# Patient Record
Sex: Female | Born: 1950 | Race: White | Hispanic: No | Marital: Married | Smoking: Never smoker
Health system: Southern US, Community
[De-identification: ages and names within clinical notes are randomized; demographics above are authoritative.]

## PROBLEM LIST (undated history)

## (undated) DIAGNOSIS — J449 Chronic obstructive pulmonary disease, unspecified: Secondary | ICD-10-CM

## (undated) DIAGNOSIS — M797 Fibromyalgia: Secondary | ICD-10-CM

## (undated) DIAGNOSIS — R42 Dizziness and giddiness: Secondary | ICD-10-CM

## (undated) DIAGNOSIS — I509 Heart failure, unspecified: Secondary | ICD-10-CM

## (undated) DIAGNOSIS — I10 Essential (primary) hypertension: Secondary | ICD-10-CM

## (undated) DIAGNOSIS — E7800 Pure hypercholesterolemia, unspecified: Secondary | ICD-10-CM

## (undated) HISTORY — PX: SHOULDER SURGERY: SHX246

## (undated) HISTORY — PX: KNEE SURGERY: SHX244

## (undated) HISTORY — PX: BACK SURGERY: SHX140

## (undated) HISTORY — PX: ANTERIOR FUSION CERVICAL SPINE: SUR626

---

## 2014-05-12 ENCOUNTER — Encounter (HOSPITAL_COMMUNITY): Payer: Self-pay | Admitting: Emergency Medicine

## 2014-05-12 ENCOUNTER — Inpatient Hospital Stay (HOSPITAL_COMMUNITY)
Admission: EM | Admit: 2014-05-12 | Discharge: 2014-05-14 | DRG: 641 | Disposition: A | Discharge status: Home Health | Payer: Medicare Other | Attending: Internal Medicine | Admitting: Internal Medicine

## 2014-05-12 ENCOUNTER — Emergency Department (HOSPITAL_COMMUNITY): Payer: Medicare Other

## 2014-05-12 DIAGNOSIS — R42 Dizziness and giddiness: Secondary | ICD-10-CM

## 2014-05-12 DIAGNOSIS — W1830XA Fall on same level, unspecified, initial encounter: Secondary | ICD-10-CM | POA: Diagnosis present

## 2014-05-12 DIAGNOSIS — S0990XA Unspecified injury of head, initial encounter: Secondary | ICD-10-CM

## 2014-05-12 DIAGNOSIS — E861 Hypovolemia: Secondary | ICD-10-CM | POA: Diagnosis present

## 2014-05-12 DIAGNOSIS — Y92014 Private driveway to single-family (private) house as the place of occurrence of the external cause: Secondary | ICD-10-CM

## 2014-05-12 DIAGNOSIS — Z9181 History of falling: Secondary | ICD-10-CM

## 2014-05-12 DIAGNOSIS — R55 Syncope and collapse: Secondary | ICD-10-CM

## 2014-05-12 DIAGNOSIS — I951 Orthostatic hypotension: Secondary | ICD-10-CM | POA: Diagnosis present

## 2014-05-12 DIAGNOSIS — W19XXXA Unspecified fall, initial encounter: Secondary | ICD-10-CM | POA: Diagnosis present

## 2014-05-12 DIAGNOSIS — E780 Pure hypercholesterolemia: Secondary | ICD-10-CM | POA: Diagnosis present

## 2014-05-12 DIAGNOSIS — S0083XA Contusion of other part of head, initial encounter: Secondary | ICD-10-CM | POA: Diagnosis present

## 2014-05-12 DIAGNOSIS — E860 Dehydration: Secondary | ICD-10-CM | POA: Diagnosis not present

## 2014-05-12 DIAGNOSIS — I10 Essential (primary) hypertension: Secondary | ICD-10-CM | POA: Diagnosis present

## 2014-05-12 HISTORY — DX: Essential (primary) hypertension: I10

## 2014-05-12 HISTORY — DX: Dizziness and giddiness: R42

## 2014-05-12 HISTORY — DX: Pure hypercholesterolemia, unspecified: E78.00

## 2014-05-12 LAB — CBC WITH DIFFERENTIAL/PLATELET
BASOS ABS: 0 10*3/uL (ref 0.0–0.1)
BASOS PCT: 0 % (ref 0–1)
Eosinophils Absolute: 0.1 10*3/uL (ref 0.0–0.7)
Eosinophils Relative: 1 % (ref 0–5)
HCT: 32.7 % — ABNORMAL LOW (ref 36.0–46.0)
Hemoglobin: 11.3 g/dL — ABNORMAL LOW (ref 12.0–15.0)
Lymphocytes Relative: 28 % (ref 12–46)
Lymphs Abs: 2.4 10*3/uL (ref 0.7–4.0)
MCH: 32.1 pg (ref 26.0–34.0)
MCHC: 34.6 g/dL (ref 30.0–36.0)
MCV: 92.9 fL (ref 78.0–100.0)
MONO ABS: 0.8 10*3/uL (ref 0.1–1.0)
MONOS PCT: 10 % (ref 3–12)
NEUTROS ABS: 5.3 10*3/uL (ref 1.7–7.7)
NEUTROS PCT: 61 % (ref 43–77)
Platelets: 179 10*3/uL (ref 150–400)
RBC: 3.52 MIL/uL — ABNORMAL LOW (ref 3.87–5.11)
RDW: 13.5 % (ref 11.5–15.5)
WBC: 8.6 10*3/uL (ref 4.0–10.5)

## 2014-05-12 LAB — RAPID URINE DRUG SCREEN, HOSP PERFORMED
Amphetamines: NOT DETECTED
BENZODIAZEPINES: NOT DETECTED
Barbiturates: NOT DETECTED
COCAINE: NOT DETECTED
OPIATES: NOT DETECTED
Tetrahydrocannabinol: NOT DETECTED

## 2014-05-12 LAB — COMPREHENSIVE METABOLIC PANEL
ALK PHOS: 60 U/L (ref 39–117)
ALT: 13 U/L (ref 0–35)
ANION GAP: 15 (ref 5–15)
AST: 16 U/L (ref 0–37)
Albumin: 3.4 g/dL — ABNORMAL LOW (ref 3.5–5.2)
BILIRUBIN TOTAL: 0.4 mg/dL (ref 0.3–1.2)
BUN: 25 mg/dL — AB (ref 6–23)
CHLORIDE: 95 meq/L — AB (ref 96–112)
CO2: 21 meq/L (ref 19–32)
CREATININE: 1.08 mg/dL (ref 0.50–1.10)
Calcium: 8.8 mg/dL (ref 8.4–10.5)
GFR, EST AFRICAN AMERICAN: 62 mL/min — AB (ref >90)
GFR, EST NON AFRICAN AMERICAN: 54 mL/min — AB (ref >90)
GLUCOSE: 87 mg/dL (ref 70–99)
POTASSIUM: 4.3 meq/L (ref 3.7–5.3)
Sodium: 131 mEq/L — ABNORMAL LOW (ref 137–147)
Total Protein: 7 g/dL (ref 6.0–8.3)

## 2014-05-12 LAB — CREATININE, SERUM
Creatinine, Ser: 1.03 mg/dL (ref 0.50–1.10)
GFR, EST AFRICAN AMERICAN: 66 mL/min — AB (ref >90)
GFR, EST NON AFRICAN AMERICAN: 57 mL/min — AB (ref >90)

## 2014-05-12 LAB — URINALYSIS, ROUTINE W REFLEX MICROSCOPIC
Bilirubin Urine: NEGATIVE
GLUCOSE, UA: NEGATIVE mg/dL
Hgb urine dipstick: NEGATIVE
Ketones, ur: NEGATIVE mg/dL
LEUKOCYTES UA: NEGATIVE
NITRITE: NEGATIVE
Protein, ur: NEGATIVE mg/dL
Urobilinogen, UA: 0.2 mg/dL (ref 0.0–1.0)
pH: 6 (ref 5.0–8.0)

## 2014-05-12 LAB — ETHANOL: Alcohol, Ethyl (B): <11 mg/dL (ref 0–11)

## 2014-05-12 LAB — TROPONIN I: Troponin I: <0.3 ng/mL (ref <0.30)

## 2014-05-12 LAB — CBC
HCT: 32.9 % — ABNORMAL LOW (ref 36.0–46.0)
Hemoglobin: 11.3 g/dL — ABNORMAL LOW (ref 12.0–15.0)
MCH: 31.7 pg (ref 26.0–34.0)
MCHC: 34.3 g/dL (ref 30.0–36.0)
MCV: 92.4 fL (ref 78.0–100.0)
PLATELETS: 171 10*3/uL (ref 150–400)
RBC: 3.56 MIL/uL — AB (ref 3.87–5.11)
RDW: 13.4 % (ref 11.5–15.5)
WBC: 7.9 10*3/uL (ref 4.0–10.5)

## 2014-05-12 MED ORDER — RAMIPRIL 5 MG PO CAPS
5.0000 mg | ORAL_CAPSULE | Freq: Two times a day (BID) | ORAL | Status: DC
  Administered 2014-05-12: 5 mg via ORAL
  Filled 2014-05-12 (×2): qty 1

## 2014-05-12 MED ORDER — ALBUTEROL SULFATE HFA 108 (90 BASE) MCG/ACT IN AERS: 2.0000 | INHALATION_SPRAY | RESPIRATORY_TRACT | Status: DC | PRN

## 2014-05-12 MED ORDER — TIOTROPIUM BROMIDE MONOHYDRATE 18 MCG IN CAPS
18.0000 ug | ORAL_CAPSULE | Freq: Every day | RESPIRATORY_TRACT | Status: DC
  Administered 2014-05-13 – 2014-05-14 (×2): 18 ug via RESPIRATORY_TRACT
  Filled 2014-05-12: qty 5

## 2014-05-12 MED ORDER — NITROGLYCERIN 0.4 MG SL SUBL: 0.4000 mg | SUBLINGUAL_TABLET | SUBLINGUAL | Status: DC | PRN

## 2014-05-12 MED ORDER — SODIUM CHLORIDE 0.9 % IV SOLN
INTRAVENOUS | Status: DC
  Administered 2014-05-12 – 2014-05-14 (×4): via INTRAVENOUS

## 2014-05-12 MED ORDER — SODIUM CHLORIDE 0.9 % IV BOLUS (SEPSIS)
250.0000 mL | Freq: Once | INTRAVENOUS | Status: AC
  Administered 2014-05-12: 250 mL via INTRAVENOUS

## 2014-05-12 MED ORDER — LIDOCAINE 5 % EX PTCH
1.0000 | MEDICATED_PATCH | Freq: Every day | CUTANEOUS | Status: DC | PRN
  Filled 2014-05-12: qty 1

## 2014-05-12 MED ORDER — VERAPAMIL HCL ER 240 MG PO TBCR
240.0000 mg | EXTENDED_RELEASE_TABLET | Freq: Every day | ORAL | Status: DC
Start: 2014-05-12 — End: 2014-05-14
  Administered 2014-05-12: 240 mg via ORAL
  Filled 2014-05-12 (×2): qty 1

## 2014-05-12 MED ORDER — HYDROCHLOROTHIAZIDE 25 MG PO TABS: 25.0000 mg | ORAL_TABLET | Freq: Every day | ORAL | Status: DC | PRN

## 2014-05-12 MED ORDER — HEPARIN SODIUM (PORCINE) 5000 UNIT/ML IJ SOLN
5000.0000 [IU] | Freq: Three times a day (TID) | INTRAMUSCULAR | Status: DC
  Administered 2014-05-12 – 2014-05-14 (×6): 5000 [IU] via SUBCUTANEOUS
  Filled 2014-05-12 (×6): qty 1

## 2014-05-12 MED ORDER — OMEGA-3-ACID ETHYL ESTERS 1 G PO CAPS
1.0000 g | ORAL_CAPSULE | Freq: Every day | ORAL | Status: DC
  Administered 2014-05-13 – 2014-05-14 (×2): 1 g via ORAL
  Filled 2014-05-12 (×2): qty 1

## 2014-05-12 MED ORDER — MIRABEGRON ER 25 MG PO TB24
25.0000 mg | ORAL_TABLET | Freq: Every day | ORAL | Status: DC
  Administered 2014-05-13 – 2014-05-14 (×2): 25 mg via ORAL
  Filled 2014-05-12 (×2): qty 1

## 2014-05-12 MED ORDER — IRBESARTAN 150 MG PO TABS
150.0000 mg | ORAL_TABLET | Freq: Every day | ORAL | Status: DC
  Administered 2014-05-13: 150 mg via ORAL
  Filled 2014-05-12: qty 1

## 2014-05-12 MED ORDER — SODIUM CHLORIDE 0.9 % IJ SOLN
3.0000 mL | Freq: Two times a day (BID) | INTRAMUSCULAR | Status: DC
  Administered 2014-05-12 – 2014-05-13 (×3): 3 mL via INTRAVENOUS

## 2014-05-12 MED ORDER — MECLIZINE HCL 12.5 MG PO TABS
25.0000 mg | ORAL_TABLET | Freq: Once | ORAL | Status: AC
  Administered 2014-05-12: 25 mg via ORAL
  Filled 2014-05-12: qty 2

## 2014-05-12 MED ORDER — SODIUM CHLORIDE 0.9 % IV SOLN: INTRAVENOUS | Status: DC

## 2014-05-12 MED ORDER — ONDANSETRON HCL 4 MG/2ML IJ SOLN: 4.0000 mg | Freq: Four times a day (QID) | INTRAMUSCULAR | Status: DC | PRN

## 2014-05-12 MED ORDER — MILNACIPRAN HCL 50 MG PO TABS
100.0000 mg | ORAL_TABLET | Freq: Two times a day (BID) | ORAL | Status: DC
  Administered 2014-05-13 – 2014-05-14 (×3): 100 mg via ORAL
  Filled 2014-05-12 (×8): qty 2

## 2014-05-12 MED ORDER — ROSUVASTATIN CALCIUM 10 MG PO TABS
10.0000 mg | ORAL_TABLET | Freq: Every day | ORAL | Status: DC
  Administered 2014-05-13: 10 mg via ORAL
  Filled 2014-05-12: qty 1

## 2014-05-12 MED ORDER — ACETAMINOPHEN 325 MG PO TABS
650.0000 mg | ORAL_TABLET | Freq: Four times a day (QID) | ORAL | Status: DC | PRN
  Administered 2014-05-12 – 2014-05-14 (×3): 650 mg via ORAL
  Filled 2014-05-12 (×4): qty 2

## 2014-05-12 MED ORDER — DEXTROMETHORPHAN-QUINIDINE 20-10 MG PO CAPS: 1.0000 | ORAL_CAPSULE | Freq: Two times a day (BID) | ORAL | Status: DC

## 2014-05-12 MED ORDER — RANOLAZINE ER 500 MG PO TB12
500.0000 mg | ORAL_TABLET | Freq: Two times a day (BID) | ORAL | Status: DC
  Administered 2014-05-12 – 2014-05-14 (×4): 500 mg via ORAL
  Filled 2014-05-12 (×4): qty 1

## 2014-05-12 MED ORDER — LUBIPROSTONE 24 MCG PO CAPS
24.0000 ug | ORAL_CAPSULE | Freq: Every day | ORAL | Status: DC | PRN
  Filled 2014-05-12: qty 1

## 2014-05-12 MED ORDER — ALBUTEROL SULFATE (2.5 MG/3ML) 0.083% IN NEBU: 2.5000 mg | INHALATION_SOLUTION | RESPIRATORY_TRACT | Status: DC | PRN

## 2014-05-12 MED ORDER — METOPROLOL SUCCINATE ER 50 MG PO TB24
50.0000 mg | ORAL_TABLET | Freq: Every day | ORAL | Status: DC
  Filled 2014-05-12: qty 1

## 2014-05-12 MED ORDER — PANTOPRAZOLE SODIUM 40 MG PO TBEC
40.0000 mg | DELAYED_RELEASE_TABLET | Freq: Every day | ORAL | Status: DC
  Administered 2014-05-13 – 2014-05-14 (×2): 40 mg via ORAL
  Filled 2014-05-12 (×2): qty 1

## 2014-05-12 MED ORDER — GABAPENTIN 100 MG PO CAPS
100.0000 mg | ORAL_CAPSULE | Freq: Three times a day (TID) | ORAL | Status: DC
  Administered 2014-05-12 – 2014-05-14 (×5): 100 mg via ORAL
  Filled 2014-05-12 (×5): qty 1

## 2014-05-12 MED ORDER — ONDANSETRON HCL 4 MG PO TABS: 4.0000 mg | ORAL_TABLET | Freq: Four times a day (QID) | ORAL | Status: DC | PRN

## 2014-05-12 NOTE — ED Notes (Signed)
Pt complains of dizziness on standing. Stands steady with 1 assist.
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
## 2014-05-12 NOTE — ED Notes (Signed)
Pt ambulated in hall, slightly unsteady, reports dizziness is better but still present.
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
## 2014-05-12 NOTE — ED Notes (Addendum)
Pt tripped and fell hitting back of head on cement, pt alert and oriented to name, year, age not to month and place ( pt stated it was Sept and aware that she  Was in hospital but unable to state which one or city)

## 2014-05-13 DIAGNOSIS — R55 Syncope and collapse: Secondary | ICD-10-CM

## 2014-05-13 DIAGNOSIS — E860 Dehydration: Principal | ICD-10-CM

## 2014-05-13 DIAGNOSIS — I951 Orthostatic hypotension: Secondary | ICD-10-CM

## 2014-05-13 DIAGNOSIS — I10 Essential (primary) hypertension: Secondary | ICD-10-CM

## 2014-05-13 LAB — COMPREHENSIVE METABOLIC PANEL
ALBUMIN: 3 g/dL — AB (ref 3.5–5.2)
ALT: 11 U/L (ref 0–35)
AST: 14 U/L (ref 0–37)
Alkaline Phosphatase: 55 U/L (ref 39–117)
Anion gap: 10 (ref 5–15)
BUN: 22 mg/dL (ref 6–23)
CALCIUM: 8.2 mg/dL — AB (ref 8.4–10.5)
CO2: 23 meq/L (ref 19–32)
CREATININE: 1.04 mg/dL (ref 0.50–1.10)
Chloride: 106 mEq/L (ref 96–112)
GFR calc Af Amer: 65 mL/min — ABNORMAL LOW (ref >90)
GFR calc non Af Amer: 56 mL/min — ABNORMAL LOW (ref >90)
Glucose, Bld: 121 mg/dL — ABNORMAL HIGH (ref 70–99)
Potassium: 4 mEq/L (ref 3.7–5.3)
Sodium: 139 mEq/L (ref 137–147)
TOTAL PROTEIN: 6.4 g/dL (ref 6.0–8.3)
Total Bilirubin: 0.3 mg/dL (ref 0.3–1.2)

## 2014-05-13 LAB — CBC
HEMATOCRIT: 32.2 % — AB (ref 36.0–46.0)
HEMOGLOBIN: 10.9 g/dL — AB (ref 12.0–15.0)
MCH: 31.7 pg (ref 26.0–34.0)
MCHC: 33.9 g/dL (ref 30.0–36.0)
MCV: 93.6 fL (ref 78.0–100.0)
PLATELETS: 172 10*3/uL (ref 150–400)
RBC: 3.44 MIL/uL — ABNORMAL LOW (ref 3.87–5.11)
RDW: 13.7 % (ref 11.5–15.5)
WBC: 4.3 10*3/uL (ref 4.0–10.5)

## 2014-05-13 LAB — TROPONIN I: Troponin I: <0.3 ng/mL (ref <0.30)

## 2014-05-13 MED ORDER — IBUPROFEN 800 MG PO TABS
400.0000 mg | ORAL_TABLET | Freq: Four times a day (QID) | ORAL | Status: DC | PRN
  Administered 2014-05-13: 400 mg via ORAL
  Filled 2014-05-13: qty 1

## 2014-05-13 MED ORDER — DEXTROMETHORPHAN-QUINIDINE 20-10 MG PO CAPS: 1.0000 | ORAL_CAPSULE | Freq: Two times a day (BID) | ORAL | Status: DC
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
## 2014-05-13 NOTE — Progress Notes (Signed)
Utilization review Completed Wenceslaus Gist RN BSN   

## 2014-05-13 NOTE — Progress Notes (Signed)
Pt blood pressure 98/44. Notified MD. Awaiting response. Will continue to monitor pt.

## 2014-05-14 MED ORDER — IBUPROFEN 800 MG PO TABS: 400.0000 mg | ORAL_TABLET | Freq: Four times a day (QID) | ORAL | Status: DC | PRN
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
## 2014-05-14 NOTE — Progress Notes (Signed)
Discharged home with instructions given on medications,and follow up visits,patient,and family verbalized understanding.No C/O pain or discomfort noted.Accompanied by staff to awaiting vehicle.

## 2014-05-14 NOTE — Progress Notes (Signed)
Patient ambulated in hallway times two assist.No c/o dizziness or pain noted. Patient tolerated ambulation well. Will continue to monitor.

## 2014-05-15 LAB — URINE CULTURE
COLONY COUNT: NO GROWTH
CULTURE: NO GROWTH

## 2015-09-21 DIAGNOSIS — R05 Cough: Secondary | ICD-10-CM | POA: Diagnosis not present

## 2015-09-21 DIAGNOSIS — R7303 Prediabetes: Secondary | ICD-10-CM | POA: Diagnosis not present

## 2015-09-21 DIAGNOSIS — I10 Essential (primary) hypertension: Secondary | ICD-10-CM | POA: Diagnosis not present

## 2015-09-21 DIAGNOSIS — Z7982 Long term (current) use of aspirin: Secondary | ICD-10-CM | POA: Diagnosis not present

## 2015-09-21 DIAGNOSIS — J209 Acute bronchitis, unspecified: Secondary | ICD-10-CM | POA: Diagnosis not present

## 2015-09-21 DIAGNOSIS — R509 Fever, unspecified: Secondary | ICD-10-CM | POA: Diagnosis not present

## 2015-09-21 DIAGNOSIS — R918 Other nonspecific abnormal finding of lung field: Secondary | ICD-10-CM | POA: Diagnosis not present

## 2015-09-21 DIAGNOSIS — Z79899 Other long term (current) drug therapy: Secondary | ICD-10-CM | POA: Diagnosis not present

## 2015-09-21 DIAGNOSIS — R911 Solitary pulmonary nodule: Secondary | ICD-10-CM | POA: Diagnosis not present

## 2015-10-10 DIAGNOSIS — R262 Difficulty in walking, not elsewhere classified: Secondary | ICD-10-CM | POA: Diagnosis not present

## 2015-10-10 DIAGNOSIS — M179 Osteoarthritis of knee, unspecified: Secondary | ICD-10-CM | POA: Diagnosis not present

## 2015-10-10 DIAGNOSIS — Z96652 Presence of left artificial knee joint: Secondary | ICD-10-CM | POA: Diagnosis not present

## 2015-10-30 DIAGNOSIS — I2584 Coronary atherosclerosis due to calcified coronary lesion: Secondary | ICD-10-CM | POA: Diagnosis not present

## 2015-10-30 DIAGNOSIS — K210 Gastro-esophageal reflux disease with esophagitis: Secondary | ICD-10-CM | POA: Diagnosis not present

## 2015-10-30 DIAGNOSIS — M25561 Pain in right knee: Secondary | ICD-10-CM | POA: Diagnosis not present

## 2015-10-30 DIAGNOSIS — Z6829 Body mass index (BMI) 29.0-29.9, adult: Secondary | ICD-10-CM | POA: Diagnosis not present

## 2015-10-30 DIAGNOSIS — M797 Fibromyalgia: Secondary | ICD-10-CM | POA: Diagnosis not present

## 2015-10-30 DIAGNOSIS — M1711 Unilateral primary osteoarthritis, right knee: Secondary | ICD-10-CM | POA: Diagnosis not present

## 2015-10-30 DIAGNOSIS — R2689 Other abnormalities of gait and mobility: Secondary | ICD-10-CM | POA: Diagnosis not present

## 2015-10-30 DIAGNOSIS — R262 Difficulty in walking, not elsewhere classified: Secondary | ICD-10-CM | POA: Diagnosis not present

## 2015-10-30 DIAGNOSIS — J441 Chronic obstructive pulmonary disease with (acute) exacerbation: Secondary | ICD-10-CM | POA: Diagnosis not present

## 2015-10-30 DIAGNOSIS — I10 Essential (primary) hypertension: Secondary | ICD-10-CM | POA: Diagnosis not present

## 2015-11-07 DIAGNOSIS — M25561 Pain in right knee: Secondary | ICD-10-CM | POA: Diagnosis not present

## 2015-11-07 DIAGNOSIS — M1711 Unilateral primary osteoarthritis, right knee: Secondary | ICD-10-CM | POA: Diagnosis not present

## 2015-11-14 DIAGNOSIS — M25561 Pain in right knee: Secondary | ICD-10-CM | POA: Diagnosis not present

## 2015-11-14 DIAGNOSIS — M1711 Unilateral primary osteoarthritis, right knee: Secondary | ICD-10-CM | POA: Diagnosis not present

## 2015-11-19 DIAGNOSIS — Z1231 Encounter for screening mammogram for malignant neoplasm of breast: Secondary | ICD-10-CM | POA: Diagnosis not present

## 2015-11-21 DIAGNOSIS — M25561 Pain in right knee: Secondary | ICD-10-CM | POA: Diagnosis not present

## 2015-11-21 DIAGNOSIS — M1711 Unilateral primary osteoarthritis, right knee: Secondary | ICD-10-CM | POA: Diagnosis not present

## 2015-11-28 DIAGNOSIS — M1711 Unilateral primary osteoarthritis, right knee: Secondary | ICD-10-CM | POA: Diagnosis not present

## 2015-11-28 DIAGNOSIS — M25561 Pain in right knee: Secondary | ICD-10-CM | POA: Diagnosis not present

## 2015-12-11 DIAGNOSIS — M1990 Unspecified osteoarthritis, unspecified site: Secondary | ICD-10-CM | POA: Diagnosis not present

## 2015-12-11 DIAGNOSIS — M810 Age-related osteoporosis without current pathological fracture: Secondary | ICD-10-CM | POA: Diagnosis not present

## 2015-12-11 DIAGNOSIS — K219 Gastro-esophageal reflux disease without esophagitis: Secondary | ICD-10-CM | POA: Diagnosis not present

## 2015-12-11 DIAGNOSIS — M859 Disorder of bone density and structure, unspecified: Secondary | ICD-10-CM | POA: Diagnosis not present

## 2015-12-11 DIAGNOSIS — J449 Chronic obstructive pulmonary disease, unspecified: Secondary | ICD-10-CM | POA: Diagnosis not present

## 2015-12-11 DIAGNOSIS — Z96612 Presence of left artificial shoulder joint: Secondary | ICD-10-CM | POA: Diagnosis not present

## 2015-12-11 DIAGNOSIS — I10 Essential (primary) hypertension: Secondary | ICD-10-CM | POA: Diagnosis not present

## 2015-12-11 DIAGNOSIS — Z96611 Presence of right artificial shoulder joint: Secondary | ICD-10-CM | POA: Diagnosis not present

## 2015-12-11 DIAGNOSIS — Z96652 Presence of left artificial knee joint: Secondary | ICD-10-CM | POA: Diagnosis not present

## 2015-12-11 DIAGNOSIS — Z780 Asymptomatic menopausal state: Secondary | ICD-10-CM | POA: Diagnosis not present

## 2015-12-24 DIAGNOSIS — Z7982 Long term (current) use of aspirin: Secondary | ICD-10-CM | POA: Diagnosis not present

## 2015-12-24 DIAGNOSIS — I110 Hypertensive heart disease with heart failure: Secondary | ICD-10-CM | POA: Diagnosis not present

## 2015-12-24 DIAGNOSIS — Z86010 Personal history of colonic polyps: Secondary | ICD-10-CM | POA: Diagnosis not present

## 2015-12-24 DIAGNOSIS — I509 Heart failure, unspecified: Secondary | ICD-10-CM | POA: Diagnosis not present

## 2015-12-24 DIAGNOSIS — Z79899 Other long term (current) drug therapy: Secondary | ICD-10-CM | POA: Diagnosis not present

## 2015-12-24 DIAGNOSIS — Z96612 Presence of left artificial shoulder joint: Secondary | ICD-10-CM | POA: Diagnosis not present

## 2015-12-24 DIAGNOSIS — Z96611 Presence of right artificial shoulder joint: Secondary | ICD-10-CM | POA: Diagnosis not present

## 2015-12-24 DIAGNOSIS — J449 Chronic obstructive pulmonary disease, unspecified: Secondary | ICD-10-CM | POA: Diagnosis not present

## 2015-12-24 DIAGNOSIS — Z1211 Encounter for screening for malignant neoplasm of colon: Secondary | ICD-10-CM | POA: Diagnosis not present

## 2016-01-07 DIAGNOSIS — I10 Essential (primary) hypertension: Secondary | ICD-10-CM | POA: Diagnosis not present

## 2016-01-07 DIAGNOSIS — M797 Fibromyalgia: Secondary | ICD-10-CM | POA: Diagnosis not present

## 2016-01-07 DIAGNOSIS — Z6828 Body mass index (BMI) 28.0-28.9, adult: Secondary | ICD-10-CM | POA: Diagnosis not present

## 2016-01-07 DIAGNOSIS — I2584 Coronary atherosclerosis due to calcified coronary lesion: Secondary | ICD-10-CM | POA: Diagnosis not present

## 2016-01-07 DIAGNOSIS — J441 Chronic obstructive pulmonary disease with (acute) exacerbation: Secondary | ICD-10-CM | POA: Diagnosis not present

## 2016-01-07 DIAGNOSIS — K210 Gastro-esophageal reflux disease with esophagitis: Secondary | ICD-10-CM | POA: Diagnosis not present

## 2016-01-07 DIAGNOSIS — J3089 Other allergic rhinitis: Secondary | ICD-10-CM | POA: Diagnosis not present

## 2016-02-10 DIAGNOSIS — H2513 Age-related nuclear cataract, bilateral: Secondary | ICD-10-CM | POA: Diagnosis not present

## 2016-02-10 DIAGNOSIS — H52223 Regular astigmatism, bilateral: Secondary | ICD-10-CM | POA: Diagnosis not present

## 2016-02-10 DIAGNOSIS — H524 Presbyopia: Secondary | ICD-10-CM | POA: Diagnosis not present

## 2016-02-10 DIAGNOSIS — H5203 Hypermetropia, bilateral: Secondary | ICD-10-CM | POA: Diagnosis not present

## 2016-02-21 DIAGNOSIS — J441 Chronic obstructive pulmonary disease with (acute) exacerbation: Secondary | ICD-10-CM | POA: Diagnosis not present

## 2016-02-21 DIAGNOSIS — K210 Gastro-esophageal reflux disease with esophagitis: Secondary | ICD-10-CM | POA: Diagnosis not present

## 2016-02-21 DIAGNOSIS — M797 Fibromyalgia: Secondary | ICD-10-CM | POA: Diagnosis not present

## 2016-02-21 DIAGNOSIS — I2584 Coronary atherosclerosis due to calcified coronary lesion: Secondary | ICD-10-CM | POA: Diagnosis not present

## 2016-02-21 DIAGNOSIS — I10 Essential (primary) hypertension: Secondary | ICD-10-CM | POA: Diagnosis not present

## 2016-03-24 DIAGNOSIS — I10 Essential (primary) hypertension: Secondary | ICD-10-CM | POA: Diagnosis not present

## 2016-03-24 DIAGNOSIS — M797 Fibromyalgia: Secondary | ICD-10-CM | POA: Diagnosis not present

## 2016-03-24 DIAGNOSIS — J441 Chronic obstructive pulmonary disease with (acute) exacerbation: Secondary | ICD-10-CM | POA: Diagnosis not present

## 2016-03-24 DIAGNOSIS — K210 Gastro-esophageal reflux disease with esophagitis: Secondary | ICD-10-CM | POA: Diagnosis not present

## 2016-03-24 DIAGNOSIS — I2584 Coronary atherosclerosis due to calcified coronary lesion: Secondary | ICD-10-CM | POA: Diagnosis not present

## 2016-04-09 DIAGNOSIS — I10 Essential (primary) hypertension: Secondary | ICD-10-CM | POA: Diagnosis not present

## 2016-04-09 DIAGNOSIS — K210 Gastro-esophageal reflux disease with esophagitis: Secondary | ICD-10-CM | POA: Diagnosis not present

## 2016-04-09 DIAGNOSIS — Z6828 Body mass index (BMI) 28.0-28.9, adult: Secondary | ICD-10-CM | POA: Diagnosis not present

## 2016-04-09 DIAGNOSIS — M797 Fibromyalgia: Secondary | ICD-10-CM | POA: Diagnosis not present

## 2016-04-09 DIAGNOSIS — M7552 Bursitis of left shoulder: Secondary | ICD-10-CM | POA: Diagnosis not present

## 2016-04-09 DIAGNOSIS — I2584 Coronary atherosclerosis due to calcified coronary lesion: Secondary | ICD-10-CM | POA: Diagnosis not present

## 2016-04-09 DIAGNOSIS — J441 Chronic obstructive pulmonary disease with (acute) exacerbation: Secondary | ICD-10-CM | POA: Diagnosis not present

## 2016-05-12 DIAGNOSIS — H25013 Cortical age-related cataract, bilateral: Secondary | ICD-10-CM | POA: Diagnosis not present

## 2016-05-12 DIAGNOSIS — H2513 Age-related nuclear cataract, bilateral: Secondary | ICD-10-CM | POA: Diagnosis not present

## 2016-05-12 DIAGNOSIS — H2511 Age-related nuclear cataract, right eye: Secondary | ICD-10-CM | POA: Diagnosis not present

## 2016-05-14 DIAGNOSIS — I2584 Coronary atherosclerosis due to calcified coronary lesion: Secondary | ICD-10-CM | POA: Diagnosis not present

## 2016-05-14 DIAGNOSIS — M797 Fibromyalgia: Secondary | ICD-10-CM | POA: Diagnosis not present

## 2016-05-14 DIAGNOSIS — I10 Essential (primary) hypertension: Secondary | ICD-10-CM | POA: Diagnosis not present

## 2016-05-14 DIAGNOSIS — K210 Gastro-esophageal reflux disease with esophagitis: Secondary | ICD-10-CM | POA: Diagnosis not present

## 2016-05-14 DIAGNOSIS — J441 Chronic obstructive pulmonary disease with (acute) exacerbation: Secondary | ICD-10-CM | POA: Diagnosis not present

## 2016-06-08 DIAGNOSIS — J441 Chronic obstructive pulmonary disease with (acute) exacerbation: Secondary | ICD-10-CM | POA: Diagnosis not present

## 2016-06-08 DIAGNOSIS — I2584 Coronary atherosclerosis due to calcified coronary lesion: Secondary | ICD-10-CM | POA: Diagnosis not present

## 2016-06-08 DIAGNOSIS — M797 Fibromyalgia: Secondary | ICD-10-CM | POA: Diagnosis not present

## 2016-06-08 DIAGNOSIS — I10 Essential (primary) hypertension: Secondary | ICD-10-CM | POA: Diagnosis not present

## 2016-06-08 DIAGNOSIS — K210 Gastro-esophageal reflux disease with esophagitis: Secondary | ICD-10-CM | POA: Diagnosis not present

## 2016-06-30 DIAGNOSIS — M797 Fibromyalgia: Secondary | ICD-10-CM | POA: Diagnosis not present

## 2016-06-30 DIAGNOSIS — K210 Gastro-esophageal reflux disease with esophagitis: Secondary | ICD-10-CM | POA: Diagnosis not present

## 2016-06-30 DIAGNOSIS — Z6828 Body mass index (BMI) 28.0-28.9, adult: Secondary | ICD-10-CM | POA: Diagnosis not present

## 2016-06-30 DIAGNOSIS — J441 Chronic obstructive pulmonary disease with (acute) exacerbation: Secondary | ICD-10-CM | POA: Diagnosis not present

## 2016-06-30 DIAGNOSIS — I10 Essential (primary) hypertension: Secondary | ICD-10-CM | POA: Diagnosis not present

## 2016-06-30 DIAGNOSIS — I2510 Atherosclerotic heart disease of native coronary artery without angina pectoris: Secondary | ICD-10-CM | POA: Diagnosis not present

## 2016-06-30 DIAGNOSIS — M7552 Bursitis of left shoulder: Secondary | ICD-10-CM | POA: Diagnosis not present

## 2016-07-01 DIAGNOSIS — I10 Essential (primary) hypertension: Secondary | ICD-10-CM | POA: Diagnosis not present

## 2016-07-01 DIAGNOSIS — K210 Gastro-esophageal reflux disease with esophagitis: Secondary | ICD-10-CM | POA: Diagnosis not present

## 2016-07-01 DIAGNOSIS — J441 Chronic obstructive pulmonary disease with (acute) exacerbation: Secondary | ICD-10-CM | POA: Diagnosis not present

## 2016-07-01 DIAGNOSIS — M797 Fibromyalgia: Secondary | ICD-10-CM | POA: Diagnosis not present

## 2016-07-01 DIAGNOSIS — I2510 Atherosclerotic heart disease of native coronary artery without angina pectoris: Secondary | ICD-10-CM | POA: Diagnosis not present

## 2016-08-03 DIAGNOSIS — H2511 Age-related nuclear cataract, right eye: Secondary | ICD-10-CM | POA: Diagnosis not present

## 2016-08-03 DIAGNOSIS — K210 Gastro-esophageal reflux disease with esophagitis: Secondary | ICD-10-CM | POA: Diagnosis not present

## 2016-08-03 DIAGNOSIS — J441 Chronic obstructive pulmonary disease with (acute) exacerbation: Secondary | ICD-10-CM | POA: Diagnosis not present

## 2016-08-03 DIAGNOSIS — Z961 Presence of intraocular lens: Secondary | ICD-10-CM | POA: Diagnosis not present

## 2016-08-03 DIAGNOSIS — I2510 Atherosclerotic heart disease of native coronary artery without angina pectoris: Secondary | ICD-10-CM | POA: Diagnosis not present

## 2016-08-03 DIAGNOSIS — M797 Fibromyalgia: Secondary | ICD-10-CM | POA: Diagnosis not present

## 2016-08-03 DIAGNOSIS — I10 Essential (primary) hypertension: Secondary | ICD-10-CM | POA: Diagnosis not present

## 2016-08-04 DIAGNOSIS — H2512 Age-related nuclear cataract, left eye: Secondary | ICD-10-CM | POA: Diagnosis not present

## 2016-08-24 DIAGNOSIS — Z961 Presence of intraocular lens: Secondary | ICD-10-CM | POA: Diagnosis not present

## 2016-08-24 DIAGNOSIS — H2512 Age-related nuclear cataract, left eye: Secondary | ICD-10-CM | POA: Diagnosis not present

## 2016-08-27 DIAGNOSIS — I10 Essential (primary) hypertension: Secondary | ICD-10-CM | POA: Diagnosis not present

## 2016-08-27 DIAGNOSIS — M797 Fibromyalgia: Secondary | ICD-10-CM | POA: Diagnosis not present

## 2016-08-27 DIAGNOSIS — K210 Gastro-esophageal reflux disease with esophagitis: Secondary | ICD-10-CM | POA: Diagnosis not present

## 2016-08-27 DIAGNOSIS — J441 Chronic obstructive pulmonary disease with (acute) exacerbation: Secondary | ICD-10-CM | POA: Diagnosis not present

## 2016-08-27 DIAGNOSIS — I2510 Atherosclerotic heart disease of native coronary artery without angina pectoris: Secondary | ICD-10-CM | POA: Diagnosis not present

## 2016-09-29 DIAGNOSIS — I10 Essential (primary) hypertension: Secondary | ICD-10-CM | POA: Diagnosis not present

## 2016-09-29 DIAGNOSIS — J441 Chronic obstructive pulmonary disease with (acute) exacerbation: Secondary | ICD-10-CM | POA: Diagnosis not present

## 2016-09-29 DIAGNOSIS — I2510 Atherosclerotic heart disease of native coronary artery without angina pectoris: Secondary | ICD-10-CM | POA: Diagnosis not present

## 2016-09-29 DIAGNOSIS — K210 Gastro-esophageal reflux disease with esophagitis: Secondary | ICD-10-CM | POA: Diagnosis not present

## 2016-09-29 DIAGNOSIS — M797 Fibromyalgia: Secondary | ICD-10-CM | POA: Diagnosis not present

## 2016-10-02 DIAGNOSIS — M797 Fibromyalgia: Secondary | ICD-10-CM | POA: Diagnosis not present

## 2016-10-02 DIAGNOSIS — Z961 Presence of intraocular lens: Secondary | ICD-10-CM | POA: Diagnosis not present

## 2016-10-02 DIAGNOSIS — I10 Essential (primary) hypertension: Secondary | ICD-10-CM | POA: Diagnosis not present

## 2016-10-02 DIAGNOSIS — I2510 Atherosclerotic heart disease of native coronary artery without angina pectoris: Secondary | ICD-10-CM | POA: Diagnosis not present

## 2016-10-02 DIAGNOSIS — J441 Chronic obstructive pulmonary disease with (acute) exacerbation: Secondary | ICD-10-CM | POA: Diagnosis not present

## 2016-10-02 DIAGNOSIS — H52222 Regular astigmatism, left eye: Secondary | ICD-10-CM | POA: Diagnosis not present

## 2016-10-02 DIAGNOSIS — Z6828 Body mass index (BMI) 28.0-28.9, adult: Secondary | ICD-10-CM | POA: Diagnosis not present

## 2016-10-02 DIAGNOSIS — Z0000 Encounter for general adult medical examination without abnormal findings: Secondary | ICD-10-CM | POA: Diagnosis not present

## 2016-10-02 DIAGNOSIS — K210 Gastro-esophageal reflux disease with esophagitis: Secondary | ICD-10-CM | POA: Diagnosis not present

## 2016-10-02 DIAGNOSIS — M7552 Bursitis of left shoulder: Secondary | ICD-10-CM | POA: Diagnosis not present

## 2016-10-30 DIAGNOSIS — J441 Chronic obstructive pulmonary disease with (acute) exacerbation: Secondary | ICD-10-CM | POA: Diagnosis not present

## 2016-10-30 DIAGNOSIS — I10 Essential (primary) hypertension: Secondary | ICD-10-CM | POA: Diagnosis not present

## 2016-10-30 DIAGNOSIS — K210 Gastro-esophageal reflux disease with esophagitis: Secondary | ICD-10-CM | POA: Diagnosis not present

## 2016-10-30 DIAGNOSIS — M797 Fibromyalgia: Secondary | ICD-10-CM | POA: Diagnosis not present

## 2016-10-30 DIAGNOSIS — I2584 Coronary atherosclerosis due to calcified coronary lesion: Secondary | ICD-10-CM | POA: Diagnosis not present

## 2016-11-19 DIAGNOSIS — I2584 Coronary atherosclerosis due to calcified coronary lesion: Secondary | ICD-10-CM | POA: Diagnosis not present

## 2016-11-19 DIAGNOSIS — I10 Essential (primary) hypertension: Secondary | ICD-10-CM | POA: Diagnosis not present

## 2016-11-19 DIAGNOSIS — K210 Gastro-esophageal reflux disease with esophagitis: Secondary | ICD-10-CM | POA: Diagnosis not present

## 2016-11-19 DIAGNOSIS — M797 Fibromyalgia: Secondary | ICD-10-CM | POA: Diagnosis not present

## 2016-11-19 DIAGNOSIS — J441 Chronic obstructive pulmonary disease with (acute) exacerbation: Secondary | ICD-10-CM | POA: Diagnosis not present

## 2017-01-04 DIAGNOSIS — I2510 Atherosclerotic heart disease of native coronary artery without angina pectoris: Secondary | ICD-10-CM | POA: Diagnosis not present

## 2017-01-04 DIAGNOSIS — J441 Chronic obstructive pulmonary disease with (acute) exacerbation: Secondary | ICD-10-CM | POA: Diagnosis not present

## 2017-01-04 DIAGNOSIS — Z6829 Body mass index (BMI) 29.0-29.9, adult: Secondary | ICD-10-CM | POA: Diagnosis not present

## 2017-01-04 DIAGNOSIS — M797 Fibromyalgia: Secondary | ICD-10-CM | POA: Diagnosis not present

## 2017-01-04 DIAGNOSIS — I10 Essential (primary) hypertension: Secondary | ICD-10-CM | POA: Diagnosis not present

## 2017-01-04 DIAGNOSIS — K210 Gastro-esophageal reflux disease with esophagitis: Secondary | ICD-10-CM | POA: Diagnosis not present

## 2017-01-06 DIAGNOSIS — M7989 Other specified soft tissue disorders: Secondary | ICD-10-CM | POA: Diagnosis not present

## 2017-01-06 DIAGNOSIS — M79605 Pain in left leg: Secondary | ICD-10-CM | POA: Diagnosis not present

## 2017-03-23 ENCOUNTER — Other Ambulatory Visit: Payer: Self-pay | Admitting: Family Medicine

## 2017-03-31 DIAGNOSIS — J441 Chronic obstructive pulmonary disease with (acute) exacerbation: Secondary | ICD-10-CM | POA: Diagnosis not present

## 2017-03-31 DIAGNOSIS — I10 Essential (primary) hypertension: Secondary | ICD-10-CM | POA: Diagnosis not present

## 2017-03-31 DIAGNOSIS — I2584 Coronary atherosclerosis due to calcified coronary lesion: Secondary | ICD-10-CM | POA: Diagnosis not present

## 2017-03-31 DIAGNOSIS — K210 Gastro-esophageal reflux disease with esophagitis: Secondary | ICD-10-CM | POA: Diagnosis not present

## 2017-04-06 DIAGNOSIS — I10 Essential (primary) hypertension: Secondary | ICD-10-CM | POA: Diagnosis not present

## 2017-04-06 DIAGNOSIS — Z6827 Body mass index (BMI) 27.0-27.9, adult: Secondary | ICD-10-CM | POA: Diagnosis not present

## 2017-04-06 DIAGNOSIS — J441 Chronic obstructive pulmonary disease with (acute) exacerbation: Secondary | ICD-10-CM | POA: Diagnosis not present

## 2017-04-06 DIAGNOSIS — I2510 Atherosclerotic heart disease of native coronary artery without angina pectoris: Secondary | ICD-10-CM | POA: Diagnosis not present

## 2017-04-06 DIAGNOSIS — K210 Gastro-esophageal reflux disease with esophagitis: Secondary | ICD-10-CM | POA: Diagnosis not present

## 2017-04-06 DIAGNOSIS — L570 Actinic keratosis: Secondary | ICD-10-CM | POA: Diagnosis not present

## 2017-04-28 DIAGNOSIS — R928 Other abnormal and inconclusive findings on diagnostic imaging of breast: Secondary | ICD-10-CM | POA: Diagnosis not present

## 2017-04-28 DIAGNOSIS — N6314 Unspecified lump in the right breast, lower inner quadrant: Secondary | ICD-10-CM | POA: Diagnosis not present

## 2017-05-15 ENCOUNTER — Emergency Department (HOSPITAL_COMMUNITY): Payer: Medicare HMO

## 2017-05-15 ENCOUNTER — Encounter (HOSPITAL_COMMUNITY): Payer: Self-pay | Admitting: Cardiology

## 2017-05-15 ENCOUNTER — Inpatient Hospital Stay (HOSPITAL_COMMUNITY)
Admission: EM | Admit: 2017-05-15 | Discharge: 2017-05-20 | DRG: 481 | Disposition: A | Discharge status: Skilled Nursing Facility | Payer: Medicare HMO | Attending: Internal Medicine | Admitting: Internal Medicine

## 2017-05-15 DIAGNOSIS — Z79899 Other long term (current) drug therapy: Secondary | ICD-10-CM

## 2017-05-15 DIAGNOSIS — W108XXA Fall (on) (from) other stairs and steps, initial encounter: Secondary | ICD-10-CM | POA: Diagnosis present

## 2017-05-15 DIAGNOSIS — M7989 Other specified soft tissue disorders: Secondary | ICD-10-CM

## 2017-05-15 DIAGNOSIS — I110 Hypertensive heart disease with heart failure: Secondary | ICD-10-CM | POA: Diagnosis present

## 2017-05-15 DIAGNOSIS — S99912A Unspecified injury of left ankle, initial encounter: Secondary | ICD-10-CM | POA: Diagnosis not present

## 2017-05-15 DIAGNOSIS — Y92018 Other place in single-family (private) house as the place of occurrence of the external cause: Secondary | ICD-10-CM | POA: Diagnosis not present

## 2017-05-15 DIAGNOSIS — M1612 Unilateral primary osteoarthritis, left hip: Secondary | ICD-10-CM | POA: Diagnosis not present

## 2017-05-15 DIAGNOSIS — Y9301 Activity, walking, marching and hiking: Secondary | ICD-10-CM | POA: Diagnosis not present

## 2017-05-15 DIAGNOSIS — J4542 Moderate persistent asthma with status asthmaticus: Secondary | ICD-10-CM | POA: Diagnosis not present

## 2017-05-15 DIAGNOSIS — Z9889 Other specified postprocedural states: Secondary | ICD-10-CM

## 2017-05-15 DIAGNOSIS — S728X2A Other fracture of left femur, initial encounter for closed fracture: Secondary | ICD-10-CM | POA: Diagnosis not present

## 2017-05-15 DIAGNOSIS — Z23 Encounter for immunization: Secondary | ICD-10-CM | POA: Diagnosis not present

## 2017-05-15 DIAGNOSIS — Z8249 Family history of ischemic heart disease and other diseases of the circulatory system: Secondary | ICD-10-CM

## 2017-05-15 DIAGNOSIS — S299XXA Unspecified injury of thorax, initial encounter: Secondary | ICD-10-CM | POA: Diagnosis not present

## 2017-05-15 DIAGNOSIS — E876 Hypokalemia: Secondary | ICD-10-CM | POA: Diagnosis not present

## 2017-05-15 DIAGNOSIS — X58XXXD Exposure to other specified factors, subsequent encounter: Secondary | ICD-10-CM | POA: Diagnosis not present

## 2017-05-15 DIAGNOSIS — R9431 Abnormal electrocardiogram [ECG] [EKG]: Secondary | ICD-10-CM

## 2017-05-15 DIAGNOSIS — Z96653 Presence of artificial knee joint, bilateral: Secondary | ICD-10-CM | POA: Diagnosis present

## 2017-05-15 DIAGNOSIS — E7800 Pure hypercholesterolemia, unspecified: Secondary | ICD-10-CM | POA: Diagnosis not present

## 2017-05-15 DIAGNOSIS — I959 Hypotension, unspecified: Secondary | ICD-10-CM | POA: Diagnosis not present

## 2017-05-15 DIAGNOSIS — W19XXXA Unspecified fall, initial encounter: Secondary | ICD-10-CM

## 2017-05-15 DIAGNOSIS — Z7982 Long term (current) use of aspirin: Secondary | ICD-10-CM | POA: Diagnosis not present

## 2017-05-15 DIAGNOSIS — Z981 Arthrodesis status: Secondary | ICD-10-CM | POA: Diagnosis not present

## 2017-05-15 DIAGNOSIS — S72142A Displaced intertrochanteric fracture of left femur, initial encounter for closed fracture: Secondary | ICD-10-CM

## 2017-05-15 DIAGNOSIS — M79605 Pain in left leg: Secondary | ICD-10-CM | POA: Diagnosis not present

## 2017-05-15 DIAGNOSIS — Z833 Family history of diabetes mellitus: Secondary | ICD-10-CM | POA: Diagnosis not present

## 2017-05-15 DIAGNOSIS — M797 Fibromyalgia: Secondary | ICD-10-CM | POA: Diagnosis present

## 2017-05-15 DIAGNOSIS — R05 Cough: Secondary | ICD-10-CM | POA: Diagnosis not present

## 2017-05-15 DIAGNOSIS — I10 Essential (primary) hypertension: Secondary | ICD-10-CM | POA: Diagnosis not present

## 2017-05-15 DIAGNOSIS — S72002A Fracture of unspecified part of neck of left femur, initial encounter for closed fracture: Secondary | ICD-10-CM

## 2017-05-15 DIAGNOSIS — S8992XA Unspecified injury of left lower leg, initial encounter: Secondary | ICD-10-CM | POA: Diagnosis not present

## 2017-05-15 DIAGNOSIS — D62 Acute posthemorrhagic anemia: Secondary | ICD-10-CM | POA: Diagnosis not present

## 2017-05-15 DIAGNOSIS — Z96612 Presence of left artificial shoulder joint: Secondary | ICD-10-CM | POA: Diagnosis present

## 2017-05-15 DIAGNOSIS — Z7951 Long term (current) use of inhaled steroids: Secondary | ICD-10-CM | POA: Diagnosis not present

## 2017-05-15 DIAGNOSIS — J449 Chronic obstructive pulmonary disease, unspecified: Secondary | ICD-10-CM | POA: Diagnosis present

## 2017-05-15 DIAGNOSIS — I5022 Chronic systolic (congestive) heart failure: Secondary | ICD-10-CM | POA: Diagnosis not present

## 2017-05-15 DIAGNOSIS — M6281 Muscle weakness (generalized): Secondary | ICD-10-CM | POA: Diagnosis not present

## 2017-05-15 DIAGNOSIS — S72012A Unspecified intracapsular fracture of left femur, initial encounter for closed fracture: Secondary | ICD-10-CM | POA: Diagnosis not present

## 2017-05-15 DIAGNOSIS — I4581 Long QT syndrome: Secondary | ICD-10-CM | POA: Diagnosis not present

## 2017-05-15 DIAGNOSIS — R2689 Other abnormalities of gait and mobility: Secondary | ICD-10-CM | POA: Diagnosis not present

## 2017-05-15 DIAGNOSIS — Z96611 Presence of right artificial shoulder joint: Secondary | ICD-10-CM | POA: Diagnosis present

## 2017-05-15 DIAGNOSIS — Z4789 Encounter for other orthopedic aftercare: Secondary | ICD-10-CM | POA: Diagnosis not present

## 2017-05-15 DIAGNOSIS — R278 Other lack of coordination: Secondary | ICD-10-CM | POA: Diagnosis not present

## 2017-05-15 DIAGNOSIS — S72002D Fracture of unspecified part of neck of left femur, subsequent encounter for closed fracture with routine healing: Secondary | ICD-10-CM | POA: Diagnosis not present

## 2017-05-15 DIAGNOSIS — Z471 Aftercare following joint replacement surgery: Secondary | ICD-10-CM | POA: Diagnosis not present

## 2017-05-15 DIAGNOSIS — M25552 Pain in left hip: Secondary | ICD-10-CM | POA: Diagnosis not present

## 2017-05-15 DIAGNOSIS — M25562 Pain in left knee: Secondary | ICD-10-CM | POA: Diagnosis not present

## 2017-05-15 DIAGNOSIS — E785 Hyperlipidemia, unspecified: Secondary | ICD-10-CM | POA: Diagnosis present

## 2017-05-15 DIAGNOSIS — Z96652 Presence of left artificial knee joint: Secondary | ICD-10-CM | POA: Diagnosis not present

## 2017-05-15 HISTORY — DX: Fibromyalgia: M79.7

## 2017-05-15 HISTORY — DX: Chronic obstructive pulmonary disease, unspecified: J44.9

## 2017-05-15 HISTORY — DX: Heart failure, unspecified: I50.9

## 2017-05-15 LAB — PROTIME-INR
INR: 0.93
PROTHROMBIN TIME: 12.4 s (ref 11.4–15.2)

## 2017-05-15 LAB — MAGNESIUM: MAGNESIUM: 2 mg/dL (ref 1.7–2.4)

## 2017-05-15 LAB — CBC WITH DIFFERENTIAL/PLATELET
Basophils Absolute: 0 10*3/uL (ref 0.0–0.1)
Basophils Relative: 0 %
EOS ABS: 0 10*3/uL (ref 0.0–0.7)
Eosinophils Relative: 0 %
HEMATOCRIT: 38 % (ref 36.0–46.0)
HEMOGLOBIN: 12.7 g/dL (ref 12.0–15.0)
LYMPHS ABS: 1.7 10*3/uL (ref 0.7–4.0)
Lymphocytes Relative: 13 %
MCH: 31.5 pg (ref 26.0–34.0)
MCHC: 33.4 g/dL (ref 30.0–36.0)
MCV: 94.3 fL (ref 78.0–100.0)
MONOS PCT: 7 %
Monocytes Absolute: 0.9 10*3/uL (ref 0.1–1.0)
NEUTROS ABS: 10.1 10*3/uL — AB (ref 1.7–7.7)
NEUTROS PCT: 80 %
Platelets: 222 10*3/uL (ref 150–400)
RBC: 4.03 MIL/uL (ref 3.87–5.11)
RDW: 14 % (ref 11.5–15.5)
WBC: 12.7 10*3/uL — AB (ref 4.0–10.5)

## 2017-05-15 LAB — BASIC METABOLIC PANEL
ANION GAP: 10 (ref 5–15)
BUN: 14 mg/dL (ref 6–20)
CO2: 23 mmol/L (ref 22–32)
CREATININE: 1.12 mg/dL — AB (ref 0.44–1.00)
Calcium: 8.8 mg/dL — ABNORMAL LOW (ref 8.9–10.3)
Chloride: 103 mmol/L (ref 101–111)
GFR calc non Af Amer: 50 mL/min — ABNORMAL LOW (ref >60)
GFR, EST AFRICAN AMERICAN: 58 mL/min — AB (ref >60)
Glucose, Bld: 95 mg/dL (ref 65–99)
POTASSIUM: 3.4 mmol/L — AB (ref 3.5–5.1)
Sodium: 136 mmol/L (ref 135–145)

## 2017-05-15 LAB — TYPE AND SCREEN
ABO/RH(D): O POS
ANTIBODY SCREEN: NEGATIVE

## 2017-05-15 MED ORDER — FENTANYL CITRATE (PF) 100 MCG/2ML IJ SOLN
50.0000 ug | INTRAMUSCULAR | Status: AC | PRN
  Administered 2017-05-15 (×4): 50 ug via INTRAVENOUS
  Filled 2017-05-15 (×4): qty 2

## 2017-05-15 MED ORDER — KETOROLAC TROMETHAMINE 30 MG/ML IJ SOLN
INTRAMUSCULAR | Status: AC
  Filled 2017-05-15: qty 1

## 2017-05-15 MED ORDER — ONDANSETRON HCL 4 MG/2ML IJ SOLN
4.0000 mg | Freq: Once | INTRAMUSCULAR | Status: AC
  Administered 2017-05-15: 4 mg via INTRAVENOUS
  Filled 2017-05-15: qty 2

## 2017-05-15 MED ORDER — HYDROMORPHONE HCL 1 MG/ML IJ SOLN
1.0000 mg | Freq: Once | INTRAMUSCULAR | Status: AC
  Administered 2017-05-15: 1 mg via INTRAVENOUS

## 2017-05-15 MED ORDER — POTASSIUM CHLORIDE IN NACL 20-0.9 MEQ/L-% IV SOLN
INTRAVENOUS | Status: DC
  Administered 2017-05-16: 02:00:00 via INTRAVENOUS

## 2017-05-15 MED ORDER — KETOROLAC TROMETHAMINE 30 MG/ML IJ SOLN
30.0000 mg | Freq: Once | INTRAMUSCULAR | Status: AC
  Administered 2017-05-15: 30 mg via INTRAVENOUS

## 2017-05-15 MED ORDER — HYDROMORPHONE HCL 1 MG/ML IJ SOLN
INTRAMUSCULAR | Status: AC
  Filled 2017-05-15: qty 1

## 2017-05-15 MED ORDER — SODIUM CHLORIDE 0.9 % IV BOLUS (SEPSIS)
250.0000 mL | Freq: Once | INTRAVENOUS | Status: AC
  Administered 2017-05-15: 250 mL via INTRAVENOUS

## 2017-05-15 NOTE — H&P (Signed)
History and Physical    Judy Snyder GNF:[id number] DOB: [birth year] DOA: 05/15/2017  PCP: Barbra Sarks, MD   Patient coming from: Home.  I have personally briefly reviewed patient's old medical records in [facility]  Chief Complaint: Fall  HPI: Judy Snyder is a 66 y.o. female with medical history significant of systolic CHF, COPD, fibromyalgia, chronic back pain, hyperlipidemia, hypertension, vertigo who is brought in to the emergency department after falling down 3 steps on her stairs at home, falling on her left side and injuring her left hip. She felt immediate pain and inability to bear weight on the extremity. She denies chest pain, palpitations, dizziness, diaphoresis, nausea, emesis or any other symptomatology prior to her fall. This was an accident per patient. She complains of frequent constipation, but denies abdominal pain, diarrhea, melena or hematochezia. Denies dysuria, frequency or hematuria.    ED Course: Initial vital signs in the EDTemperature 98.48F, pulse 73, respirations 18, blood pressure 144/74 mmHg and O2 sat 100% on room air. She received fentanyl 50 g 3 doses with minimal results in the ED. I subsequently ordered ketorolac 30 mg IVP 1 dose and Dilaudid 1 mg IVP 1 dose with more positive results on her pain.  Her workup shows WBC of 12.7 with 80% neutrophils, hemoglobin 12.7 g/dL and platelets 846. Her PT and INR were normal. Sodium 136, potassium 3.4, chloride 103 and bicarbonate 23 mmol/L. BUN 14, creatinine 1.12, glucose 95 magnesium 2.0 mg/dL.  Imaging: Extensive imaging showed a minimally displaced intertrochanteric fracture of the left hip. Please see images and multiple radiology reports for further detail.  Review of Systems: As per HPI otherwise 10 point review of systems negative.    Past Medical History:  Diagnosis Date  . CHF (congestive heart failure) (HCC)   . COPD (chronic obstructive pulmonary disease) (HCC)   . Fibromyalgia   .  High cholesterol   . Hypertension   . Vertigo     Past Surgical History:  Procedure Laterality Date  . BACK SURGERY    . KNEE SURGERY    . SHOULDER SURGERY       reports that she has never smoked. She does not have any smokeless tobacco history on file. She reports that she drinks alcohol. She reports that she does not use drugs.  No Known Allergies  Family History  Problem Relation Age of Onset  . Breast cancer Mother   . Throat cancer Mother   . Diabetes Mellitus II Mother   . CAD Father        CABG  . Lung cancer Father   . Diabetes Mellitus II Father   . Emphysema Father   . Breast cancer Sister   . ALS Brother   . Cervical cancer Sister     Prior to Admission medications   Medication Sig Start Date End Date Taking? Authorizing Provider  acetaminophen (TYLENOL) 650 MG CR tablet Take 650 mg by mouth every 8 (eight) hours as needed for pain.   Yes [provider]  albuterol (PROVENTIL HFA;VENTOLIN HFA) 108 (90 BASE) MCG/ACT inhaler Inhale 2 puffs into the lungs every 4 (four) hours as needed for wheezing or shortness of breath.   Yes [provider]  aspirin EC 81 MG tablet Take 81 mg by mouth daily.   Yes [provider]  BREO ELLIPTA 200-25 MCG/INH AEPB Inhale 1 puff into the lungs daily. 03/29/17  Yes [provider]  Cholecalciferol (VITAMIN D3 PO) Take 1 capsule by mouth  daily.   Yes [provider]  hydrochlorothiazide (HYDRODIURIL) 25 MG tablet Take 25 mg by mouth daily.   Yes [provider]  lubiprostone (AMITIZA) 24 MCG capsule Take 24 mcg by mouth daily as needed for constipation.   Yes [provider]  metoprolol tartrate (LOPRESSOR) 50 MG tablet Take 50 mg by mouth 2 (two) times daily.   Yes [provider]  Milnacipran HCl (SAVELLA) 100 MG TABS tablet Take 100 mg by mouth 2 (two) times daily.   Yes [provider]  Omega-3 Fatty Acids (FISH OIL PO) Take 1 capsule by mouth daily.    Yes [provider]  omeprazole (PRILOSEC) 40 MG capsule Take 40 mg by mouth daily.   Yes [provider]  ranolazine (RANEXA) 1000 MG SR tablet Take 1,000 mg by mouth daily.    Yes [provider]  thiamine (VITAMIN B-1) 100 MG tablet Take 100 mg by mouth daily.   Yes [provider]  tiotropium (SPIRIVA) 18 MCG inhalation capsule Place 18 mcg into inhaler and inhale daily.   Yes [provider]  verapamil (CALAN-SR) 240 MG CR tablet Take 240 mg by mouth at bedtime.   Yes [provider]  alendronate (FOSAMAX) 70 MG tablet Take 1 tablet by mouth once a week. 03/29/17   [provider]  mirabegron ER (MYRBETRIQ) 25 MG TB24 tablet Take 25 mg by mouth daily.    [provider]  nitroGLYCERIN (NITROSTAT) 0.4 MG SL tablet Place 0.4 mg under the tongue every 5 (five) minutes as needed for chest pain.    [provider]    Physical Exam: Vitals:   05/15/17 2223 05/15/17 2230 05/15/17 2300 05/15/17 2341  BP: 126/65 125/69 122/67 (!) 131/56  Pulse: 83   78  Resp: 16 20 18 17   Temp:      TempSrc:      SpO2: 98%   96%  Weight:      Height:        Constitutional: NAD, calm, comfortable Eyes: PERRL, lids and conjunctivae normal ENMT: Mucous membranes are moist. Posterior pharynx clear of any exudate or lesions. Neck: normal, supple, no masses, no thyromegaly Respiratory: clear to auscultation bilaterally, no wheezing, no crackles. Normal respiratory effort. No accessory muscle use.  Cardiovascular: Regular rate and rhythm, no murmurs / rubs / gallops. No extremity edema. 2+ pedal pulses. No carotid bruits.  Abdomen: Soft, no tenderness, no masses palpated. No hepatosplenomegaly. Bowel sounds positive.  Musculoskeletal: no clubbing / cyanosis. LLE decreased ROM with left hip tenderness, no contractures. Normal muscle tone.  Skin: no significant rashes, lesions, ulcers on limited skin exam. Neurologic: CN 2-12 grossly  intact. Sensation intact, DTR normal. Strength 5/5 in all 4.  Psychiatric: Normal judgment and insight. Alert and oriented x 4. Normal mood.     Labs on Admission: I have personally reviewed following labs and imaging studies  CBC:  Recent Labs Lab 05/15/17 2021  WBC 12.7*  NEUTROABS 10.1*  HGB 12.7  HCT 38.0  MCV 94.3  PLT 222   Basic Metabolic Panel:  Recent Labs Lab 05/15/17 2021  NA 136  K 3.4*  CL 103  CO2 23  GLUCOSE 95  BUN 14  CREATININE 1.12*  CALCIUM 8.8*  MG 2.0   GFR: Estimated Creatinine Clearance: 47.8 mL/min (A) (by C-G formula based on SCr of 1.12 mg/dL (H)). Liver Function Tests: No results for input(s): AST, ALT, ALKPHOS, BILITOT, PROT, ALBUMIN in the last 168 hours.  No results for input(s): LIPASE, AMYLASE in the last 168 hours. No results for input(s): AMMONIA in the last 168 hours. Coagulation Profile:  Recent Labs Lab 05/15/17 2021  INR 0.93   Cardiac Enzymes: No results for input(s): CKTOTAL, CKMB, CKMBINDEX, TROPONINI in the last 168 hours. BNP (last 3 results) No results for input(s): PROBNP in the last 8760 hours. HbA1C: No results for input(s): HGBA1C in the last 72 hours. CBG: No results for input(s): GLUCAP in the last 168 hours. Lipid Profile: No results for input(s): CHOL, HDL, LDLCALC, TRIG, CHOLHDL, LDLDIRECT in the last 72 hours. Thyroid Function Tests: No results for input(s): TSH, T4TOTAL, FREET4, T3FREE, THYROIDAB in the last 72 hours. Anemia Panel: No results for input(s): VITAMINB12, FOLATE, FERRITIN, TIBC, IRON, RETICCTPCT in the last 72 hours. Urine analysis:    Component Value Date/Time   COLORURINE YELLOW 05/12/2014 1620   APPEARANCEUR CLEAR 05/12/2014 1620   LABSPEC >1.030 (H) 05/12/2014 1620   PHURINE 6.0 05/12/2014 1620   GLUCOSEU NEGATIVE 05/12/2014 1620   HGBUR NEGATIVE 05/12/2014 1620   BILIRUBINUR NEGATIVE 05/12/2014 1620   KETONESUR NEGATIVE 05/12/2014 1620   PROTEINUR NEGATIVE 05/12/2014 1620     UROBILINOGEN 0.2 05/12/2014 1620   NITRITE NEGATIVE 05/12/2014 1620   LEUKOCYTESUR NEGATIVE 05/12/2014 1620    Radiological Exams on Admission: Dg Pelvis 1-2 Views  Result Date: 05/15/2017 CLINICAL DATA:  Fall down 3 steps with left hip pain. EXAM: PELVIS - 1-2 VIEW COMPARISON:  05/12/2014 FINDINGS: Exam demonstrates diffuse osteopenia. There is mild symmetric degenerative change of the hips. There is a minimally displaced intertrochanteric fracture of the left hip. There are degenerative changes of the spine. Hardware unchanged over the midline L5-S1 level. IMPRESSION: Minimally displaced intertrochanteric fracture of the left hip. Electronically Signed   By: Elberta Fortis M.D.   On: 05/15/2017 20:02   Dg Tibia/fibula Left  Result Date: 05/15/2017 CLINICAL DATA:  Fall down 3 steps with left lower leg pain. EXAM: LEFT TIBIA AND FIBULA - 2 VIEW COMPARISON:  None. FINDINGS: Left knee arthroplasty intact. Focal cortical buckling over the proximal third of the tibial diaphysis which may be due to acute versus chronic injury. IMPRESSION: Cortical buckling over the proximal third of the tibial diaphysis which may be due to acute versus chronic injury. Left knee arthroplasty intact. Electronically Signed   By: Elberta Fortis M.D.   On: 05/15/2017 20:00   Dg Ankle Complete Left  Result Date: 05/15/2017 CLINICAL DATA:  Fall down 3 steps with left ankle pain. EXAM: LEFT ANKLE COMPLETE - 3+ VIEW COMPARISON:  None. FINDINGS: Diffuse osteopenia. No acute fracture. Ankle mortise is within normal. Possible abnormal rotation of the talus on the lateral film. IMPRESSION: No acute fracture identified. Possible abnormal rotation of the talus on the lateral film. Consider left foot series for further evaluation. Electronically Signed   By: Elberta Fortis M.D.   On: 05/15/2017 20:05   Ct Tibia Fibula Left Wo Contrast  Result Date: 05/15/2017 CLINICAL DATA:  Assess for fracture along the mid tibia. Initial  encounter. EXAM: CT OF THE LOWER LEFT EXTREMITY WITHOUT CONTRAST TECHNIQUE: Multidetector CT imaging of the lower left extremity was performed according to the standard protocol. COMPARISON:  None. FINDINGS: Bones/Joint/Cartilage There is no evidence of acute fracture or dislocation. The mild deformity of the proximal tibial diaphysis reflects remote healed injury. The patient's total knee arthroplasty is grossly unremarkable in appearance, without evidence of loosening. The fibula appears intact. There is chronic anterior narrowing of the ankle  mortise, with scattered subcortical cystic change and sclerosis. No significant knee joint effusion is seen. Ligaments Suboptimally assessed by CT. Muscles and Tendons There is mild atrophy of the musculature of the distal thigh. No acute abnormalities are seen. The visualized tendon structures are grossly unremarkable. Soft tissues No soft tissue hematoma is seen. Scattered soft tissue calcifications at the proximal lower leg are likely postoperative in nature. IMPRESSION: 1. No evidence of acute fracture or dislocation. 2. Total knee arthroplasty is unremarkable in appearance, without evidence of loosening. 3. Chronic anterior narrowing of the ankle mortise, with scattered subcortical cystic change and sclerosis. Electronically Signed   By: Roanna RaiderJeffery  Chang M.D.   On: 05/15/2017 22:39   Dg Femur Min 2 Views Left  Result Date: 05/15/2017 CLINICAL DATA:  Fall down 3 steps with left hip pain. EXAM: LEFT FEMUR 2 VIEWS COMPARISON:  None. FINDINGS: Exam demonstrates diffuse osteopenia. There is a minimally displaced intertrochanteric fracture of the left hip. Right knee arthroplasty is present. IMPRESSION: Minimally displaced intertrochanteric fracture of the left hip. Electronically Signed   By: Elberta Fortisaniel  Boyle M.D.   On: 05/15/2017 19:57    EKG: Independently reviewed. Vent. rate 85 BPM PR interval * ms QRS duration 93 ms QT/QTc 518/617 ms P-R-T axes 61 -13 177 Sinus  rhythm Abnormal R-wave progression, early transition Borderline repolarization abnormality Prolonged QT interval Since last tracing QT has lengthened  Assessment/Plan Principal Problem:   Closed intertrochanteric fracture of left hip (HCC) Admitted to telemetry/inpatient. History Barrett's. Supplemental oxygen as needed. Continue analgesics as needed. Continue antiemetics as needed. Orthopedic surgery evaluation later today. Hold aspirin and Ranexa until cleared by orthopedic surgery  Active Problems:   Benign essential HTN Continue metoprolol 50 mg by mouth daily. Follow-up blood pressure.    Hypokalemia Replacing. Magnesium was supplemented. Follow-up potassium level.    COPD (chronic obstructive pulmonary disease) (HCC) Pimental oxygen as needed. Careful use of bronchodilators giving QT interval prolongation    Prolonged Q-T interval on ECG Replace potassium and supplement magnesium. Continue Metoprolol and avoid medications that cause QT prolongation.   DVT prophylaxis: Heparin SQ. Code Status: Full code. Family Communication: Her husband was present in her room. Disposition Plan: Admit for pain control and orthopedic surgery evaluation. Consults called: Orthopedic Surgery (Dr. Romeo AppleHarrison) Admission status: Inpatient/Telemetry.   Bobette Moavid Manuel Ortiz MD [facility] Pager [phone]  If 7PM-7AM, please contact night-coverage [email or web address] Password [email or web address]  05/15/2017, 11:52 PM

## 2017-05-15 NOTE — ED Provider Notes (Signed)
[facility] EMERGENCY DEPARTMENT Provider Note   CSN: [id number] Arrival date & time: 05/15/17  1731     History   Chief Complaint Chief Complaint  Patient presents with  . Leg Injury    HPI Judy Snyder is a 66 y.o. female.  She presents for injury, falling, when going down steps.  Feels like she slipped and injured her left leg, from the hip to the toes.  She is unable to ambulate and summoned an EMS unit to bring her here.  She denies head neck or back injury.  There is been no nausea, vomiting chest or abdominal pain.  No other problems recently.  There are no other known  HPI  Past Medical History:  Diagnosis Date  . CHF (congestive heart failure) (HCC)   . COPD (chronic obstructive pulmonary disease) (HCC)   . Fibromyalgia   . High cholesterol   . Hypertension   . Vertigo     Patient Active Problem List   Diagnosis Date Noted  . Syncope 05/13/2014  . Benign essential HTN 05/13/2014  . Fall 05/12/2014  . Dehydration 05/12/2014  . Orthostatic hypotension 05/12/2014    Past Surgical History:  Procedure Laterality Date  . BACK SURGERY    . KNEE SURGERY    . SHOULDER SURGERY      OB History    No data available       Home Medications    Prior to Admission medications   Medication Sig Start Date End Date Taking? Authorizing Provider  acetaminophen (TYLENOL) 650 MG CR tablet Take 650 mg by mouth every 8 (eight) hours as needed for pain.   Yes [provider]  albuterol (PROVENTIL HFA;VENTOLIN HFA) 108 (90 BASE) MCG/ACT inhaler Inhale 2 puffs into the lungs every 4 (four) hours as needed for wheezing or shortness of breath.   Yes [provider]  aspirin EC 81 MG tablet Take 81 mg by mouth daily.   Yes [provider]  BREO ELLIPTA 200-25 MCG/INH AEPB Inhale 1 puff into the lungs daily. 03/29/17  Yes [provider]  Cholecalciferol (VITAMIN D3 PO) Take 1 capsule by mouth daily.   Yes [provider]    hydrochlorothiazide (HYDRODIURIL) 25 MG tablet Take 25 mg by mouth daily.   Yes [provider]  lubiprostone (AMITIZA) 24 MCG capsule Take 24 mcg by mouth daily as needed for constipation.   Yes [provider]  metoprolol tartrate (LOPRESSOR) 50 MG tablet Take 50 mg by mouth 2 (two) times daily.   Yes [provider]  Milnacipran HCl (SAVELLA) 100 MG TABS tablet Take 100 mg by mouth 2 (two) times daily.   Yes [provider]  Omega-3 Fatty Acids (FISH OIL PO) Take 1 capsule by mouth daily.   Yes [provider]  omeprazole (PRILOSEC) 40 MG capsule Take 40 mg by mouth daily.   Yes [provider]  ranolazine (RANEXA) 1000 MG SR tablet Take 1,000 mg by mouth daily.    Yes [provider]  thiamine (VITAMIN B-1) 100 MG tablet Take 100 mg by mouth daily.   Yes [provider]  tiotropium (SPIRIVA) 18 MCG inhalation capsule Place 18 mcg into inhaler and inhale daily.   Yes [provider]  verapamil (CALAN-SR) 240 MG CR tablet Take 240 mg by mouth at bedtime.   Yes [provider]  alendronate (FOSAMAX) 70 MG tablet Take 1 tablet by mouth once a week. 03/29/17   [provider]  mirabegron ER (MYRBETRIQ) 25 MG TB24 tablet Take 25 mg by mouth daily.    [provider]  nitroGLYCERIN (NITROSTAT) 0.4 MG SL tablet Place 0.4 mg under the tongue every 5 (five) minutes as needed for chest pain.    [provider]    Family History History reviewed. No pertinent family history.  Social History Social History  Substance Use Topics  . Smoking status: Never Smoker  . Smokeless tobacco: Not on file  . Alcohol use Yes     Comment: seldom     Allergies   Patient has no known allergies.   Review of Systems Review of Systems  All other systems reviewed and are negative.    Physical Exam Updated Vital Signs BP (!) 142/68   Pulse 70   Temp 98.1 F (36.7 C) (Oral)   Resp 18    Ht 5\' 3"  (1.6 m)   Wt 72.6 kg (160 lb)   SpO2 99%   BMI 28.34 kg/m   Physical Exam  Constitutional: She is oriented to person, place, and time. She appears well-developed and well-nourished. She appears distressed (She is uncomfortable).  HENT:  Head: Normocephalic and atraumatic.  Eyes: Pupils are equal, round, and reactive to light. Conjunctivae and EOM are normal.  Neck: Normal range of motion and phonation normal. Neck supple.  Cardiovascular: Normal rate and regular rhythm.   Pulmonary/Chest: Effort normal and breath sounds normal. No respiratory distress. She exhibits no tenderness.  Abdominal: Soft. She exhibits no distension. There is no tenderness. There is no guarding.  Musculoskeletal:  Diffuse tenderness left pelvic region to left ankle.  Slight swelling left ankle.  No gross left knee deformity.  Right leg nontender to palpation.  Neurological: She is alert and oriented to person, place, and time. She exhibits normal muscle tone.  Skin: Skin is warm and dry.  Psychiatric: She has a normal mood and affect. Her behavior is normal. Judgment and thought content normal.  Nursing note and vitals reviewed.    ED Treatments / Results  Labs (all labs ordered are listed, but only abnormal results are displayed) Labs Reviewed - No data to display  EKG  EKG Interpretation None       Radiology No results found.  Procedures Procedures (including critical care time)  Medications Ordered in ED Medications  sodium chloride 0.9 % bolus 250 mL (not administered)  fentaNYL (SUBLIMAZE) injection 50 mcg (not administered)  ondansetron (ZOFRAN) injection 4 mg (not administered)     Initial Impression / Assessment and Plan / ED Course  I have reviewed the triage vital signs and the nursing notes.  Pertinent labs & imaging results that were available during my care of the patient were reviewed by me and considered in my medical decision making (see chart for  details).  Clinical Course as of May 16 2203  Sat May 15, 2017  2026 Patient and husband updated on findings and plan  [EW]  2027 Left hip intertrochanteric fracture DG FEMUR MIN 2 VIEWS LEFT [EW]  2027 Possible mid tibial diaphyseal fracture DG Tibia/Fibula Left [EW]  2027 No fracture DG Ankle Complete Left [EW]  2027 No pelvic fracture DG Pelvis 1-2 Views [EW]  2200 Calcium: (!) 8.8 [EW]    Clinical Course User Index [EW] Mancel Bale, MD     Patient Vitals for the past 24 hrs:  BP Temp Temp src Pulse Resp SpO2 Height Weight  05/15/17 2130 (!) 116/55 - - 89 15 99 % - -  05/15/17 2000 (!) 116/59 - - 80 - 93 % - -  05/15/17 1942 - - - - - 98 % - -  05/15/17 1940 123/73 - - 83 18 97 % - -  05/15/17 1800 (!) 142/68 - - 70 - 99 % - -  05/15/17 1735 (!) 144/74 98.1 F (36.7 C) Oral 73 18 100 % - -  05/15/17 1733 - - - - - - 5\' 3"  (1.6 m) 72.6 kg (160 lb)    2105-case discussed with orthopedics who requests CT tibia to evaluate for fracture, before excepting case for management here at this facility.  21: 00 PM Reevaluation with update and discussion. After initial assessment and treatment, an updated evaluation reveals she is more comfortable now.  Findings discussed with patient and husband, all questions answered. Whit Bruni L     Final Clinical Impressions(s) / ED Diagnoses   Final diagnoses:  Closed fracture of left hip, initial encounter (HCC)  Left leg pain  Prolonged Q-T interval on ECG   Fall down stairs resulting in left hip fracture.  Likely mechanical.  She has pain in her entire left leg.  CT ordered to evaluate for left tibia fracture.  Nursing Notes Reviewed/ Care Coordinated Applicable Imaging Reviewed Interpretation of Laboratory Data incorporated into ED treatment  Plan: Admit  New Prescriptions New Prescriptions   No medications on file     Mancel Bale, MD 05/16/17 1215

## 2017-05-15 NOTE — Progress Notes (Signed)
Patient ID: Judy Snyder, female   DOB: 08/05/1950, 66 y.o.   MRN: [id number]  Asked to provide advice ref to 66 yo female s/p back surgery and tka left knee with h/o syncope 2015/ heart disease on beta blocker and ranexa as well as prn nitroglycerin She has a simple 2 part intertrochanteric hip fracture    BP (!) 116/55   Pulse 89   Temp 98.1 F (36.7 C) (Oral)   Resp 15   Ht 5\' 3"  (1.6 m)   Wt 160 lb (72.6 kg)   SpO2 99%   BMI 28.34 kg/m   CBC Latest Ref Rng & Units 05/15/2017 05/13/2014 05/12/2014  WBC 4.0 - 10.5 K/uL 12.7(H) 4.3 7.9  Hemoglobin 12.0 - 15.0 g/dL 40.912.7 10.9(L) 11.3(L)  Hematocrit 36.0 - 46.0 % 38.0 32.2(L) 32.9(L)  Platelets 150 - 400 K/uL 222 172 171   BMP Latest Ref Rng & Units 05/15/2017 05/13/2014 05/12/2014  Glucose 65 - 99 mg/dL 95 811(B121(H) -  BUN 6 - 20 mg/dL 14 22 -  Creatinine 1.470.44 - 1.00 mg/dL 8.29(F1.12(H) 6.211.04 3.081.03  Sodium 135 - 145 mmol/L 136 139 -  Potassium 3.5 - 5.1 mmol/L 3.4(L) 4.0 -  Chloride 101 - 111 mmol/L 103 106 -  CO2 22 - 32 mmol/L 23 23 -  Calcium 8.9 - 10.3 mg/dL 6.5(H8.8(L) 8.4(O8.2(L) -   Past Medical History:  Diagnosis Date  . CHF (congestive heart failure) (HCC)   . COPD (chronic obstructive pulmonary disease) (HCC)   . Fibromyalgia   . High cholesterol   . Hypertension   . Vertigo    Discussed with crna - no contraindictions recognized

## 2017-05-15 NOTE — ED Notes (Signed)
Gave EKG to Dr. Wentz.  

## 2017-05-15 NOTE — ED Provider Notes (Signed)
CT without evidence of fracture or hardware instability.  Dr. Romeo AppleHarrison will see patient in the morning.  Asked to have hospitalist admit.  Keep patient n.p.o. after midnight.   Loren RacerYelverton, Victoriano Campion, MD 05/15/17 2306

## 2017-05-15 NOTE — ED Triage Notes (Signed)
Fall down 3 steps.  C/o pain to left leg.  Knee replacement to same leg.

## 2017-05-16 ENCOUNTER — Encounter (HOSPITAL_COMMUNITY)
Admission: EM | Disposition: A | Discharge status: Skilled Nursing Facility | Payer: Self-pay | Source: Home / Self Care | Attending: Internal Medicine

## 2017-05-16 ENCOUNTER — Inpatient Hospital Stay (HOSPITAL_COMMUNITY): Payer: Medicare HMO | Admitting: Anesthesiology

## 2017-05-16 ENCOUNTER — Inpatient Hospital Stay (HOSPITAL_COMMUNITY): Payer: Medicare HMO

## 2017-05-16 ENCOUNTER — Encounter (HOSPITAL_COMMUNITY): Payer: Self-pay

## 2017-05-16 DIAGNOSIS — R9431 Abnormal electrocardiogram [ECG] [EKG]: Secondary | ICD-10-CM

## 2017-05-16 HISTORY — PX: INTRAMEDULLARY (IM) NAIL INTERTROCHANTERIC: SHX5875

## 2017-05-16 LAB — BASIC METABOLIC PANEL
ANION GAP: 7 (ref 5–15)
BUN: 15 mg/dL (ref 6–20)
CO2: 24 mmol/L (ref 22–32)
Calcium: 8.5 mg/dL — ABNORMAL LOW (ref 8.9–10.3)
Chloride: 105 mmol/L (ref 101–111)
Creatinine, Ser: 1.15 mg/dL — ABNORMAL HIGH (ref 0.44–1.00)
GFR calc Af Amer: 57 mL/min — ABNORMAL LOW (ref >60)
GFR calc non Af Amer: 49 mL/min — ABNORMAL LOW (ref >60)
GLUCOSE: 103 mg/dL — AB (ref 65–99)
POTASSIUM: 4.3 mmol/L (ref 3.5–5.1)
Sodium: 136 mmol/L (ref 135–145)

## 2017-05-16 LAB — CBC
HEMATOCRIT: 37 % (ref 36.0–46.0)
Hemoglobin: 11.8 g/dL — ABNORMAL LOW (ref 12.0–15.0)
MCH: 30.5 pg (ref 26.0–34.0)
MCHC: 31.9 g/dL (ref 30.0–36.0)
MCV: 95.6 fL (ref 78.0–100.0)
PLATELETS: 211 10*3/uL (ref 150–400)
RBC: 3.87 MIL/uL (ref 3.87–5.11)
RDW: 14.3 % (ref 11.5–15.5)
WBC: 7 10*3/uL (ref 4.0–10.5)

## 2017-05-16 LAB — SURGICAL PCR SCREEN
MRSA, PCR: NEGATIVE
Staphylococcus aureus: NEGATIVE

## 2017-05-16 SURGERY — FIXATION, FRACTURE, INTERTROCHANTERIC, WITH INTRAMEDULLARY ROD
Anesthesia: Spinal | Laterality: Left

## 2017-05-16 MED ORDER — ACETAMINOPHEN 650 MG RE SUPP: 650.0000 mg | Freq: Four times a day (QID) | RECTAL | Status: DC | PRN

## 2017-05-16 MED ORDER — SODIUM CHLORIDE 0.9 % IV SOLN
INTRAVENOUS | Status: DC
  Administered 2017-05-16 – 2017-05-17 (×2): via INTRAVENOUS

## 2017-05-16 MED ORDER — HEPARIN SODIUM (PORCINE) 5000 UNIT/ML IJ SOLN
5000.0000 [IU] | Freq: Three times a day (TID) | INTRAMUSCULAR | Status: DC
  Filled 2017-05-16: qty 1

## 2017-05-16 MED ORDER — BUPIVACAINE-EPINEPHRINE (PF) 0.5% -1:200000 IJ SOLN
INTRAMUSCULAR | Status: AC
  Filled 2017-05-16: qty 60

## 2017-05-16 MED ORDER — STERILE WATER FOR IRRIGATION IR SOLN
Status: DC | PRN
  Administered 2017-05-16: 1000 mL

## 2017-05-16 MED ORDER — HEPARIN SODIUM (PORCINE) 5000 UNIT/ML IJ SOLN: 5000.0000 [IU] | Freq: Three times a day (TID) | INTRAMUSCULAR | Status: DC

## 2017-05-16 MED ORDER — SODIUM CHLORIDE 0.9 % IJ SOLN
INTRAMUSCULAR | Status: AC
  Filled 2017-05-16: qty 10

## 2017-05-16 MED ORDER — PHENOL 1.4 % MT LIQD: 1.0000 | OROMUCOSAL | Status: DC | PRN

## 2017-05-16 MED ORDER — PHENYLEPHRINE 40 MCG/ML (10ML) SYRINGE FOR IV PUSH (FOR BLOOD PRESSURE SUPPORT)
PREFILLED_SYRINGE | INTRAVENOUS | Status: AC
  Filled 2017-05-16: qty 10

## 2017-05-16 MED ORDER — POTASSIUM CHLORIDE CRYS ER 20 MEQ PO TBCR
20.0000 meq | EXTENDED_RELEASE_TABLET | Freq: Once | ORAL | Status: AC
  Administered 2017-05-16: 20 meq via ORAL
  Filled 2017-05-16: qty 1

## 2017-05-16 MED ORDER — EPINEPHRINE PF 1 MG/10ML IJ SOSY
PREFILLED_SYRINGE | INTRAMUSCULAR | Status: DC | PRN
Start: 2017-05-16 — End: 2017-05-16
  Administered 2017-05-16: .1 ug via INTRATRACHEAL

## 2017-05-16 MED ORDER — LUBIPROSTONE 24 MCG PO CAPS: 24.0000 ug | ORAL_CAPSULE | Freq: Every day | ORAL | Status: DC | PRN

## 2017-05-16 MED ORDER — MAGNESIUM CITRATE PO SOLN: 0.5000 | Freq: Every day | ORAL | Status: DC | PRN

## 2017-05-16 MED ORDER — CEFAZOLIN SODIUM-DEXTROSE 2-3 GM-%(50ML) IV SOLR
INTRAVENOUS | Status: DC | PRN
  Administered 2017-05-16: 2 g via INTRAVENOUS

## 2017-05-16 MED ORDER — PANTOPRAZOLE SODIUM 40 MG IV SOLR
40.0000 mg | Freq: Once | INTRAVENOUS | Status: AC
  Administered 2017-05-16: 40 mg via INTRAVENOUS
  Filled 2017-05-16: qty 40

## 2017-05-16 MED ORDER — METOCLOPRAMIDE HCL 10 MG PO TABS: 5.0000 mg | ORAL_TABLET | Freq: Three times a day (TID) | ORAL | Status: DC | PRN

## 2017-05-16 MED ORDER — METOCLOPRAMIDE HCL 5 MG/ML IJ SOLN
5.0000 mg | Freq: Three times a day (TID) | INTRAMUSCULAR | Status: DC | PRN
  Administered 2017-05-16: 5 mg via INTRAVENOUS
  Filled 2017-05-16: qty 2

## 2017-05-16 MED ORDER — PROCHLORPERAZINE EDISYLATE 5 MG/ML IJ SOLN: 5.0000 mg | INTRAMUSCULAR | Status: DC | PRN

## 2017-05-16 MED ORDER — PROPOFOL 10 MG/ML IV BOLUS: INTRAVENOUS | Status: DC | PRN

## 2017-05-16 MED ORDER — LACTATED RINGERS IV SOLN
INTRAVENOUS | Status: DC | PRN
  Administered 2017-05-16: 14:00:00 via INTRAVENOUS

## 2017-05-16 MED ORDER — SENNA 8.6 MG PO TABS
1.0000 | ORAL_TABLET | Freq: Two times a day (BID) | ORAL | Status: DC
  Administered 2017-05-16 – 2017-05-20 (×9): 8.6 mg via ORAL
  Filled 2017-05-16 (×9): qty 1

## 2017-05-16 MED ORDER — MILNACIPRAN HCL 50 MG PO TABS
100.0000 mg | ORAL_TABLET | Freq: Two times a day (BID) | ORAL | Status: DC
  Administered 2017-05-16 – 2017-05-20 (×9): 100 mg via ORAL
  Filled 2017-05-16 (×12): qty 2

## 2017-05-16 MED ORDER — BUPIVACAINE-EPINEPHRINE (PF) 0.5% -1:200000 IJ SOLN
INTRAMUSCULAR | Status: DC | PRN
  Administered 2017-05-16 (×2): 30 mL via PERINEURAL

## 2017-05-16 MED ORDER — EPHEDRINE SULFATE 50 MG/ML IJ SOLN
INTRAMUSCULAR | Status: AC
  Filled 2017-05-16: qty 1

## 2017-05-16 MED ORDER — HYDROCODONE-ACETAMINOPHEN 5-325 MG PO TABS
1.0000 | ORAL_TABLET | Freq: Four times a day (QID) | ORAL | Status: DC | PRN
  Administered 2017-05-17 – 2017-05-20 (×6): 2 via ORAL
  Filled 2017-05-16 (×6): qty 2

## 2017-05-16 MED ORDER — ACETAMINOPHEN 325 MG PO TABS
650.0000 mg | ORAL_TABLET | Freq: Four times a day (QID) | ORAL | Status: DC | PRN
  Administered 2017-05-19: 650 mg via ORAL
  Filled 2017-05-16: qty 2

## 2017-05-16 MED ORDER — CHLORHEXIDINE GLUCONATE 4 % EX LIQD
60.0000 mL | Freq: Once | CUTANEOUS | Status: DC
Start: 2017-05-16 — End: 2017-05-16

## 2017-05-16 MED ORDER — INFLUENZA VAC SPLIT HIGH-DOSE 0.5 ML IM SUSY
0.5000 mL | PREFILLED_SYRINGE | INTRAMUSCULAR | Status: AC
  Administered 2017-05-17: 0.5 mL via INTRAMUSCULAR
  Filled 2017-05-16: qty 0.5

## 2017-05-16 MED ORDER — CEFAZOLIN SODIUM-DEXTROSE 2-4 GM/100ML-% IV SOLN
INTRAVENOUS | Status: AC
  Filled 2017-05-16: qty 100

## 2017-05-16 MED ORDER — FENTANYL CITRATE (PF) 100 MCG/2ML IJ SOLN
INTRAMUSCULAR | Status: DC | PRN
  Administered 2017-05-16: 25 ug via INTRAVENOUS
  Administered 2017-05-16: 12.5 ug via INTRATHECAL
  Administered 2017-05-16: 25 ug via INTRAVENOUS
  Administered 2017-05-16: 12.5 ug via INTRAVENOUS

## 2017-05-16 MED ORDER — MAGNESIUM SULFATE 2 GM/50ML IV SOLN
2.0000 g | Freq: Once | INTRAVENOUS | Status: AC
  Administered 2017-05-16: 2 g via INTRAVENOUS
  Filled 2017-05-16: qty 50

## 2017-05-16 MED ORDER — SODIUM CHLORIDE 0.9 % IR SOLN
Status: DC | PRN
  Administered 2017-05-16: 1000 mL

## 2017-05-16 MED ORDER — ASPIRIN EC 325 MG PO TBEC
325.0000 mg | DELAYED_RELEASE_TABLET | Freq: Every day | ORAL | Status: DC
  Administered 2017-05-17 – 2017-05-20 (×4): 325 mg via ORAL
  Filled 2017-05-16 (×4): qty 1

## 2017-05-16 MED ORDER — METOPROLOL TARTRATE 25 MG PO TABS
25.0000 mg | ORAL_TABLET | Freq: Two times a day (BID) | ORAL | Status: DC
  Filled 2017-05-16: qty 1

## 2017-05-16 MED ORDER — HYDROMORPHONE HCL 1 MG/ML IJ SOLN
1.0000 mg | Freq: Once | INTRAMUSCULAR | Status: AC
  Administered 2017-05-16: 1 mg via INTRAVENOUS

## 2017-05-16 MED ORDER — VERAPAMIL HCL ER 240 MG PO TBCR
240.0000 mg | EXTENDED_RELEASE_TABLET | Freq: Every day | ORAL | Status: DC
  Administered 2017-05-16: 240 mg via ORAL
  Filled 2017-05-16: qty 1

## 2017-05-16 MED ORDER — PHENYLEPHRINE HCL 10 MG/ML IJ SOLN
INTRAMUSCULAR | Status: DC | PRN
  Administered 2017-05-16 (×6): 40 ug via INTRAVENOUS
  Administered 2017-05-16: 80 ug via INTRAVENOUS
  Administered 2017-05-16 (×2): 40 ug via INTRAVENOUS
  Administered 2017-05-16 (×2): 80 ug via INTRAVENOUS
  Administered 2017-05-16 (×8): 40 ug via INTRAVENOUS

## 2017-05-16 MED ORDER — MENTHOL 3 MG MT LOZG: 1.0000 | LOZENGE | OROMUCOSAL | Status: DC | PRN

## 2017-05-16 MED ORDER — PANTOPRAZOLE SODIUM 40 MG PO TBEC
40.0000 mg | DELAYED_RELEASE_TABLET | Freq: Every day | ORAL | Status: DC
  Administered 2017-05-16 – 2017-05-20 (×5): 40 mg via ORAL
  Filled 2017-05-16 (×5): qty 1

## 2017-05-16 MED ORDER — BUPIVACAINE IN DEXTROSE 0.75-8.25 % IT SOLN
INTRATHECAL | Status: DC | PRN
  Administered 2017-05-16: 15 mg via INTRATHECAL

## 2017-05-16 MED ORDER — CEFAZOLIN SODIUM-DEXTROSE 2-4 GM/100ML-% IV SOLN
2.0000 g | Freq: Four times a day (QID) | INTRAVENOUS | Status: AC
  Administered 2017-05-16 – 2017-05-17 (×2): 2 g via INTRAVENOUS
  Filled 2017-05-16 (×2): qty 100

## 2017-05-16 MED ORDER — POVIDONE-IODINE 10 % EX SWAB: 2.0000 "application " | Freq: Once | CUTANEOUS | Status: DC

## 2017-05-16 MED ORDER — CEFAZOLIN SODIUM-DEXTROSE 2-4 GM/100ML-% IV SOLN: 2.0000 g | INTRAVENOUS | Status: DC

## 2017-05-16 MED ORDER — MIDAZOLAM HCL 2 MG/2ML IJ SOLN
INTRAMUSCULAR | Status: AC
  Filled 2017-05-16: qty 2

## 2017-05-16 MED ORDER — MIDAZOLAM HCL 5 MG/5ML IJ SOLN
INTRAMUSCULAR | Status: DC | PRN
  Administered 2017-05-16: 1 mg via INTRAVENOUS
  Administered 2017-05-16 (×2): .5 mg via INTRAVENOUS

## 2017-05-16 MED ORDER — FENTANYL CITRATE (PF) 100 MCG/2ML IJ SOLN
INTRAMUSCULAR | Status: AC
  Filled 2017-05-16: qty 2

## 2017-05-16 MED ORDER — PROPOFOL 500 MG/50ML IV EMUL
INTRAVENOUS | Status: DC | PRN
  Administered 2017-05-16: 100 ug/kg/min via INTRAVENOUS

## 2017-05-16 MED ORDER — ALUM & MAG HYDROXIDE-SIMETH 200-200-20 MG/5ML PO SUSP
30.0000 mL | ORAL | Status: DC | PRN
  Administered 2017-05-16: 30 mL via ORAL
  Filled 2017-05-16: qty 30

## 2017-05-16 MED ORDER — CEFAZOLIN SODIUM-DEXTROSE 2-4 GM/100ML-% IV SOLN
INTRAVENOUS | Status: AC
  Filled 2017-05-16: qty 200

## 2017-05-16 MED ORDER — HYDROMORPHONE HCL 1 MG/ML IJ SOLN
0.5000 mg | INTRAMUSCULAR | Status: DC | PRN
Start: 2017-05-16 — End: 2017-05-20
  Administered 2017-05-16 – 2017-05-17 (×8): 0.5 mg via INTRAVENOUS
  Filled 2017-05-16 (×10): qty 1

## 2017-05-16 MED ORDER — TIOTROPIUM BROMIDE MONOHYDRATE 18 MCG IN CAPS
18.0000 ug | ORAL_CAPSULE | Freq: Every day | RESPIRATORY_TRACT | Status: DC
  Administered 2017-05-16 – 2017-05-20 (×4): 18 ug via RESPIRATORY_TRACT
  Filled 2017-05-16: qty 5

## 2017-05-16 MED ORDER — METOPROLOL TARTRATE 50 MG PO TABS
50.0000 mg | ORAL_TABLET | Freq: Two times a day (BID) | ORAL | Status: DC
  Administered 2017-05-16 (×2): 50 mg via ORAL
  Filled 2017-05-16 (×2): qty 1

## 2017-05-16 MED ORDER — EPHEDRINE SULFATE 50 MG/ML IJ SOLN
INTRAMUSCULAR | Status: DC | PRN
  Administered 2017-05-16 (×11): 5 mg via INTRAVENOUS
  Administered 2017-05-16: 10 mg via INTRAVENOUS
  Administered 2017-05-16: 5 mg via INTRAVENOUS
  Administered 2017-05-16 (×2): 10 mg via INTRAVENOUS
  Administered 2017-05-16: 5 mg via INTRAVENOUS
  Administered 2017-05-16: 10 mg via INTRAVENOUS
  Administered 2017-05-16 (×3): 5 mg via INTRAVENOUS
  Administered 2017-05-16: 10 mg via INTRAVENOUS
  Administered 2017-05-16: 5 mg via INTRAVENOUS

## 2017-05-16 MED ORDER — OXYCODONE HCL 5 MG PO TABS: 5.0000 mg | ORAL_TABLET | ORAL | Status: DC | PRN

## 2017-05-16 MED ORDER — SODIUM CHLORIDE 0.9 % IV SOLN
INTRAVENOUS | Status: DC | PRN
  Administered 2017-05-16: 12:00:00 via INTRAVENOUS

## 2017-05-16 MED ORDER — OXYCODONE HCL 5 MG PO TABS
5.0000 mg | ORAL_TABLET | ORAL | Status: DC | PRN
  Administered 2017-05-16: 10 mg via ORAL
  Administered 2017-05-17 – 2017-05-18 (×4): 5 mg via ORAL
  Administered 2017-05-18 – 2017-05-20 (×8): 10 mg via ORAL
  Filled 2017-05-16: qty 2
  Filled 2017-05-16: qty 1
  Filled 2017-05-16 (×3): qty 2
  Filled 2017-05-16 (×2): qty 1
  Filled 2017-05-16 (×5): qty 2
  Filled 2017-05-16: qty 1

## 2017-05-16 SURGICAL SUPPLY — 62 items
BAG HAMPER (MISCELLANEOUS) ×3 IMPLANT
BIT DRILL AO GAMMA 4.2X300 (BIT) ×6 IMPLANT
BLADE HEX COATED 2.75 (ELECTRODE) ×3 IMPLANT
BLADE SURG SZ10 CARB STEEL (BLADE) ×6 IMPLANT
BNDG GAUZE ELAST 4 BULKY (GAUZE/BANDAGES/DRESSINGS) ×3 IMPLANT
CHLORAPREP W/TINT 26ML (MISCELLANEOUS) ×3 IMPLANT
CLOTH BEACON ORANGE TIMEOUT ST (SAFETY) ×3 IMPLANT
COVER LIGHT HANDLE STERIS (MISCELLANEOUS) ×6 IMPLANT
COVER MAYO STAND XLG (DRAPE) ×3 IMPLANT
DECANTER SPIKE VIAL GLASS SM (MISCELLANEOUS) ×6 IMPLANT
DRAPE STERI IOBAN 125X83 (DRAPES) ×3 IMPLANT
DRSG MEPILEX BORDER 4X12 (GAUZE/BANDAGES/DRESSINGS) ×3 IMPLANT
DRSG PAD ABDOMINAL 8X10 ST (GAUZE/BANDAGES/DRESSINGS) ×3 IMPLANT
ELECT REM PT RETURN 9FT ADLT (ELECTROSURGICAL) ×3
ELECTRODE REM PT RTRN 9FT ADLT (ELECTROSURGICAL) ×1 IMPLANT
GLOVE BIOGEL M 6.5 STRL (GLOVE) ×3 IMPLANT
GLOVE BIOGEL M STRL SZ7.5 (GLOVE) ×9 IMPLANT
GLOVE BIOGEL PI IND STRL 6.5 (GLOVE) ×1 IMPLANT
GLOVE BIOGEL PI IND STRL 7.0 (GLOVE) ×2 IMPLANT
GLOVE BIOGEL PI INDICATOR 6.5 (GLOVE) ×2
GLOVE BIOGEL PI INDICATOR 7.0 (GLOVE) ×4
GLOVE SKINSENSE NS SZ8.0 LF (GLOVE) ×2
GLOVE SKINSENSE STRL SZ8.0 LF (GLOVE) ×1 IMPLANT
GLOVE SS N UNI LF 8.5 STRL (GLOVE) ×3 IMPLANT
GOWN STRL REUS W/ TWL LRG LVL3 (GOWN DISPOSABLE) ×1 IMPLANT
GOWN STRL REUS W/TWL LRG LVL3 (GOWN DISPOSABLE) ×5 IMPLANT
GOWN STRL REUS W/TWL XL LVL3 (GOWN DISPOSABLE) ×3 IMPLANT
GUIDEROD T2 3X1000 (ROD) ×6 IMPLANT
INST SET MAJOR BONE (KITS) ×3 IMPLANT
K-WIRE  3.2X450M STR (WIRE) ×2
K-WIRE 3.2X450M STR (WIRE) ×1
KIT BLADEGUARD II DBL (SET/KITS/TRAYS/PACK) ×3 IMPLANT
KIT ROOM TURNOVER AP CYSTO (KITS) ×3 IMPLANT
KWIRE 3.2X450M STR (WIRE) ×1 IMPLANT
MANIFOLD NEPTUNE II (INSTRUMENTS) ×3 IMPLANT
MARKER SKIN DUAL TIP RULER LAB (MISCELLANEOUS) ×3 IMPLANT
NAIL TROCH GAMMA 11X18 (Nail) ×3 IMPLANT
NEEDLE HYPO 21X1.5 SAFETY (NEEDLE) ×3 IMPLANT
NEEDLE SPNL 18GX3.5 QUINCKE PK (NEEDLE) ×3 IMPLANT
NS IRRIG 1000ML POUR BTL (IV SOLUTION) ×3 IMPLANT
PACK BASIC III (CUSTOM PROCEDURE TRAY) ×2
PACK SRG BSC III STRL LF ECLPS (CUSTOM PROCEDURE TRAY) ×1 IMPLANT
PENCIL HANDSWITCHING (ELECTRODE) ×3 IMPLANT
REAMER SHAFT BIXCUT (INSTRUMENTS) ×3 IMPLANT
SCREW LAG GAMMA 3 95MM (Screw) ×3 IMPLANT
SCREW LOCKING T2 F/T  5MMX35MM (Screw) ×2 IMPLANT
SCREW LOCKING T2 F/T 5MMX35MM (Screw) ×1 IMPLANT
SET BASIN LINEN APH (SET/KITS/TRAYS/PACK) ×3 IMPLANT
SLING ARM FOAM STRAP LRG (SOFTGOODS) ×3 IMPLANT
SLING ARM FOAM STRAP MED (SOFTGOODS) ×3 IMPLANT
SLING ARM FOAM STRAP XLG (SOFTGOODS) ×3 IMPLANT
SPONGE LAP 18X18 X RAY DECT (DISPOSABLE) ×6 IMPLANT
STAPLER VISISTAT 35W (STAPLE) ×3 IMPLANT
SUT BRALON NAB BRD #1 30IN (SUTURE) ×3 IMPLANT
SUT MNCRL 0 VIOLET CTX 36 (SUTURE) ×1 IMPLANT
SUT MON AB 0 CT1 (SUTURE) ×3 IMPLANT
SUT MON AB 2-0 CT1 36 (SUTURE) ×3 IMPLANT
SUT MONOCRYL 0 CTX 36 (SUTURE) ×2
SYR 30ML LL (SYRINGE) ×3 IMPLANT
SYR BULB IRRIGATION 50ML (SYRINGE) ×6 IMPLANT
TRAY FOLEY W/METER SILVER 16FR (SET/KITS/TRAYS/PACK) ×3 IMPLANT
YANKAUER SUCT BULB TIP NO VENT (SUCTIONS) ×3 IMPLANT

## 2017-05-16 NOTE — Brief Op Note (Addendum)
05/15/2017 - 05/16/2017  2:46 PM  PATIENT:  Judy Snyder  66 y.o. female  PRE-OPERATIVE DIAGNOSIS:  left hip fracture two-part intertrochanteric  POST-OPERATIVE DIAGNOSIS:  left hip fracturetwo-part intertrochanteric  PROCEDURE:  Procedure(s): INTRAMEDULLARY (IM) NAIL INTERTROCHANTRIC (Left)  Gamma nail 125 short nail 95 mm lag screw 35 mm distal locking screw Acorn screw and sliding mode  The patient was brought  To surgery after site marking and chart review. She was given Ancef 2 g. She has spinal anesthetic. She did vomit right after spinal anesthetic. The material was dark  o2 sats rations were normal the patient had a good gag reflex and we proceeded to place her on the fracture table.  The x-ray machine was brought in and radiographs were taken. The fracture was reduced with traction and internal rotation.  Sterile prep was performed. Timeout was completed.  I made an incision over the greater trochanter I divide the subcutaneous tissue. I divided the fascia. I foud the trochanter and placed a curved awl at the tip of the trochanter.. I confirm position with lateral and AP x-ray  Reamer was passed over the awl, followed by passing of the nail The nail did not pass. I then sequentially reamed up to a 13 and then proceeded to pass the nail. A second stab wound was made and the cannula was passed down to bone the threaded tip guidewire was placed in the center the femoral head on both views AP and lateral view.  I measured the guidewire at 95 mm. I passed a triple reamer over the guidewire. I passed a screw over the guidewire and then remove the guidewire after placing the proximal acorn screw and confirming engagement by toggling the distal screw driver handle.  The lateral locking bolt was placed in standard fashion.  Final x-rays confirmed reduction and hardware position  Thorough irrigation was performed of all wounds. The proximal wound was closed with #1 Bralon and 0  Monocryl. The 2 stab wounds were closed with 0 Monocryl.  60 mL of Marcaine with epinephrine was injected divided between the 3 wounds  The postoperative plan is for the patient be weightbearing as tolerated Staples can come out on postop day 14 X-rays can be obtained at postop week #2, #6, and 312 Aspirin can be used for DVT prevention for 30 days  SURGEON:  Surgeon(s) and Role:    * Vickki HearingHarrison, Stanley E, MD - Primary  PHYSICIAN ASSISTANT:   ASSISTANTS: none   ANESTHESIA:   spinal  EBL:  300 mL   BLOOD ADMINISTERED:none  DRAINS: none   LOCAL MEDICATIONS USED:  MARCAINE   , Amount: 60 ml and OTHER w/ epi  SPECIMEN:  No Specimen  DISPOSITION OF SPECIMEN:  N/A  COUNTS:  YES  TOURNIQUET:  * No tourniquets in log *  DICTATION: .Dragon Dictation  PLAN OF CARE: Admit to inpatient   PATIENT DISPOSITION:  PACU - hemodynamically stable.   Delay start of Pharmacological VTE agent (>24hrs) due to surgical blood loss or risk of bleeding: yes  [id number]

## 2017-05-16 NOTE — Anesthesia Preprocedure Evaluation (Signed)
Anesthesia Evaluation  Patient identified by MRN, date of birth, ID band Patient awake    Reviewed: Allergy & Precautions, NPO status , Patient's Chart, lab work & pertinent test results, reviewed documented beta blocker date and time   Airway Mallampati: III  TM Distance: >3 FB   Mouth opening: Limited Mouth Opening Comment: CERVICAL FUSION. LIMITED LEFT MOVEMENT. gOOD EXTENSION. Dental  (+) Teeth Intact, Chipped, Missing,    Pulmonary COPD,  COPD inhaler,    breath sounds clear to auscultation       Cardiovascular hypertension, Pt. on medications and Pt. on home beta blockers +CHF   Rate:Normal     Neuro/Psych    GI/Hepatic   Endo/Other    Renal/GU      Musculoskeletal  (+) Fibromyalgia -  Abdominal   Peds  Hematology   Anesthesia Other Findings   Reproductive/Obstetrics                             Anesthesia Physical Anesthesia Plan  ASA: III and emergent  Anesthesia Plan: Spinal   Post-op Pain Management:    Induction:   PONV Risk Score and Plan:   Airway Management Planned: Simple Face Mask  Additional Equipment:   Intra-op Plan:   Post-operative Plan:   Informed Consent:   Dental advisory given  Plan Discussed with:   Anesthesia Plan Comments: (Plan SAB. Will proceed with GOT if unable to place block.)        Anesthesia Quick Evaluation

## 2017-05-16 NOTE — Anesthesia Postprocedure Evaluation (Signed)
Anesthesia Post Note  Patient: Judy Snyder  Procedure(s) Performed: INTRAMEDULLARY (IM) NAIL INTERTROCHANTRIC (Left )  Patient location during evaluation: PACU Anesthesia Type: Spinal Level of consciousness: awake and alert Pain management: satisfactory to patient Vital Signs Assessment: post-procedure vital signs reviewed and stable Respiratory status: spontaneous breathing and patient connected to nasal cannula oxygen Cardiovascular status: stable Postop Assessment: spinal receding Anesthetic complications: no     Last Vitals:  Vitals:   05/16/17 1521 05/16/17 1530  BP: 106/65 (!) 107/43  Pulse: 66 70  Resp: 18 20  Temp:    SpO2: 95% 93%    Last Pain:  Vitals:   05/16/17 1530  TempSrc:   PainSc: 0-No pain    LLE Motor Response: Purposeful movement (05/16/17 1540) LLE Sensation: Increased (05/16/17 1540) RLE Motor Response: Purposeful movement (05/16/17 1540) RLE Sensation: Increased (05/16/17 1540) L Sensory Level: S3-Medial thigh (05/16/17 1540) R Sensory Level: S3-Medial thigh (05/16/17 1540)  Minerva AreolaYATES,Adeline Petitfrere

## 2017-05-16 NOTE — Op Note (Signed)
05/15/2017 - 05/16/2017  2:46 PM  PATIENT:  Judy Snyder  66 y.o. female  PRE-OPERATIVE DIAGNOSIS:  left hip fracture two-part intertrochanteric  POST-OPERATIVE DIAGNOSIS:  left hip fracturetwo-part intertrochanteric  PROCEDURE:  Procedure(s): INTRAMEDULLARY (IM) NAIL INTERTROCHANTRIC (Left)  Gamma nail 125 short nail 95 mm lag screw 35 mm distal locking screw Acorn screw and sliding mode  The patient was brought  To surgery after site marking and chart review. She was given Ancef 2 g. She has spinal anesthetic. She did vomit right after spinal anesthetic. The material was dark  o2 sats rations were normal the patient had a good gag reflex and we proceeded to place her on the fracture table.  The x-ray machine was brought in and radiographs were taken. The fracture was reduced with traction and internal rotation.  Sterile prep was performed. Timeout was completed.  I made an incision over the greater trochanter I divide the subcutaneous tissue. I divided the fascia. I foud the trochanter and placed a curved awl at the tip of the trochanter.. I confirm position with lateral and AP x-ray  Reamer was passed over the awl, followed by passing of the nail The nail did not pass. I then sequentially reamed up to a 13 and then proceeded to pass the nail. A second stab wound was made and the cannula was passed down to bone the threaded tip guidewire was placed in the center the femoral head on both views AP and lateral view.  I measured the guidewire at 95 mm. I passed a triple reamer over the guidewire. I passed a screw over the guidewire and then remove the guidewire after placing the proximal acorn screw and confirming engagement by toggling the distal screw driver handle.  The lateral locking bolt was placed in standard fashion.  Final x-rays confirmed reduction and hardware position  Thorough irrigation was performed of all wounds. The proximal wound was closed with #1 Bralon and 0  Monocryl. The 2 stab wounds were closed with 0 Monocryl.  60 mL of Marcaine with epinephrine was injected divided between the 3 wounds  The postoperative plan is for the patient be weightbearing as tolerated Staples can come out on postop day 14 X-rays can be obtained at postop week #2, #6, and 312 Aspirin can be used for DVT prevention for 30 days  SURGEON:  Surgeon(s) and Role:    * Anaise Sterbenz E, MD - Primary  PHYSICIAN ASSISTANT:   ASSISTANTS: none   ANESTHESIA:   spinal  EBL:  300 mL   BLOOD ADMINISTERED:none  DRAINS: none   LOCAL MEDICATIONS USED:  MARCAINE   , Amount: 60 ml and OTHER w/ epi  SPECIMEN:  No Specimen  DISPOSITION OF SPECIMEN:  N/A  COUNTS:  YES  TOURNIQUET:  * No tourniquets in log *  DICTATION: .Dragon Dictation  PLAN OF CARE: Admit to inpatient   PATIENT DISPOSITION:  PACU - hemodynamically stable.   Delay start of Pharmacological VTE agent (>24hrs) due to surgical blood loss or risk of bleeding: yes  [id number]  

## 2017-05-16 NOTE — Transfer of Care (Signed)
Immediate Anesthesia Transfer of Care Note  Patient: Judy Snyder  Procedure(s) Performed: INTRAMEDULLARY (IM) NAIL INTERTROCHANTRIC (Left )  Patient Location: PACU  Anesthesia Type:MAC  Level of Consciousness: awake and alert   Airway & Oxygen Therapy: Patient Spontanous Breathing and Patient connected to nasal cannula oxygen  Post-op Assessment: Report given to RN and Post -op Vital signs reviewed and stable  Post vital signs: Reviewed and stable  Last Vitals:  Vitals:   05/16/17 0542 05/16/17 0902  BP: (!) 98/45   Pulse: 70   Resp: 18   Temp: 36.7 C   SpO2: (!) 71% 90%    Last Pain:  Vitals:   05/16/17 1053  TempSrc:   PainSc: 6       Patients Stated Pain Goal: 0 (16/10/96 0454)  Complications: No apparent anesthesia complications

## 2017-05-16 NOTE — Progress Notes (Signed)
Patient unable to tolerate every 2 hour turns.  Will continue to attempt to reposition every 2 hours.

## 2017-05-16 NOTE — Consult Note (Signed)
Consult   Req: Judy Snyder Arien, MD   Reason : Fractured hip   Chief complaint: pain left hip x 1 day doi = 05/15/2017  66 yo female with h/o  Past Medical History: No date: CHF (congestive heart failure) (HCC) No date: COPD (chronic obstructive pulmonary disease) (HCC) No date: Fibromyalgia No date: High cholesterol No date: Hypertension No date: Vertigo  Fell on 10/27 injuring the left hip   She c/o pain Location Left hip pain Duration one day Timing constant Quality dull     Review of Systems  Constitutional: Negative for fever.  HENT: Negative for hearing loss.   Eyes: Negative for blurred vision.  Respiratory: Positive for shortness of breath.   Cardiovascular: Positive for chest pain.  Gastrointestinal: Negative.   Genitourinary: Negative.   Musculoskeletal: Positive for back pain, joint pain and myalgias.  Skin: Negative for rash.  Neurological: Positive for dizziness.  Endo/Heme/Allergies: Negative.   Psychiatric/Behavioral: Negative.    Past Medical History:  Diagnosis Date  . CHF (congestive heart failure) (HCC)   . COPD (chronic obstructive pulmonary disease) (HCC)   . Fibromyalgia   . High cholesterol   . Hypertension   . Vertigo    Past Surgical History:  Procedure Laterality Date  . BACK SURGERY    . KNEE SURGERY    . SHOULDER SURGERY     Family History  Problem Relation Age of Onset  . Breast cancer Mother   . Throat cancer Mother   . Diabetes Mellitus II Mother   . CAD Father        CABG  . Lung cancer Father   . Diabetes Mellitus II Father   . Emphysema Father   . Breast cancer Sister   . ALS Brother   . Cervical cancer Sister    Social History  Substance Use Topics  . Smoking status: Never Smoker  . Smokeless tobacco: Never Used  . Alcohol use Yes     Comment: seldom    Current Facility-Administered Medications:  .  0.9 % NaCl with KCl 20 mEq/ L  infusion, , Intravenous, Continuous, Bobette Mortiz, David Manuel, MD, Last Rate: 88 mL/hr  at 05/16/17 0145 .  heparin injection 5,000 Units, 5,000 Units, Subcutaneous, Q8H, Bobette Mortiz, David Manuel, MD .  HYDROmorphone (DILAUDID) injection 0.5 mg, 0.5 mg, Intravenous, Q2H PRN, Bobette Mortiz, David Manuel, MD, 0.5 mg at 05/16/17 0551 .  [START ON 05/17/2017] Influenza vac split quadrivalent PF (FLUZONE HIGH-DOSE) injection 0.5 mL, 0.5 mL, Intramuscular, Tomorrow-1000, Bobette Mortiz, David Manuel, MD .  lubiprostone [facility](AMITIZA) capsule 24 mcg, 24 mcg, Oral, Daily PRN, Bobette Mortiz, David Manuel, MD .  magnesium citrate solution 0.5 Bottle, 0.5 Bottle, Oral, Daily PRN, Bobette Mortiz, David Manuel, MD .  metoprolol tartrate (LOPRESSOR) tablet 50 mg, 50 mg, Oral, BID, Bobette Mortiz, David Manuel, MD, 50 mg at 05/16/17 0155 .  Milnacipran (SAVELLA) tablet TABS 100 mg, 100 mg, Oral, BID, Bobette Mortiz, David Manuel, MD .  oxyCODONE (Oxy IR/ROXICODONE) immediate release tablet 5 mg, 5 mg, Oral, Q4H PRN, Bobette Mortiz, David Manuel, MD .  pantoprazole (PROTONIX) EC tablet 40 mg, 40 mg, Oral, Daily, Bobette Mortiz, David Manuel, MD .  prochlorperazine (COMPAZINE) injection 5 mg, 5 mg, Intravenous, Q4H PRN, Bobette Mortiz, David Manuel, MD .  Gwyndolyn Kaufmans[facility](SENOKOT) tablet 8.6 mg, 1 tablet, Oral, BID, Bobette Mortiz, David Manuel, MD .  tiotropium [facility](SPIRIVA) inhalation capsule 18 mcg, 18 mcg, Inhalation, Daily, Bobette Mortiz, David Manuel, MD .  verapamil (CALAN-SR) CR tablet 240 mg, 240 mg, Oral, QHS, Bobette Mortiz, David Manuel, MD, 240 mg  at 05/16/17 0157  CBC Latest Ref Rng & Units 05/16/2017 05/15/2017 05/13/2014  WBC 4.0 - 10.5 K/uL 7.0 12.7(H) 4.3  Hemoglobin 12.0 - 15.0 g/dL 11.8(L) 12.7 10.9(L)  Hematocrit 36.0 - 46.0 % 37.0 38.0 32.2(L)  Platelets 150 - 400 K/uL 211 222 172   BMP Latest Ref Rng & Units 05/16/2017 05/15/2017 05/13/2014  Glucose 65 - 99 mg/dL 161(W) 95 960(A)  BUN 6 - 20 mg/dL 15 14 22   Creatinine 0.44 - 1.00 mg/dL 5.40(J) 8.11(B) 1.47  Sodium 135 - 145 mmol/L 136 136 139  Potassium 3.5 - 5.1 mmol/L 4.3 3.4(L) 4.0  Chloride 101 - 111 mmol/L 105 103 106  CO2 22 - 32 mmol/L 24  23 23   Calcium 8.9 - 10.3 mg/dL 8.2(N) 5.6(O) 1.3(Y)   My x-ray interpretation is that she has a two-part intertrochanteric fracture left hip. I cannot see the posterior medial portion of the fracture due to poor imaging this will have to be assessed at the time of surgery whether this is a stable fracture or not.  Physical Exam  Constitutional: She is oriented to person, place, and time. She appears well-developed and well-nourished. No distress.  HENT:  Head: Normocephalic and atraumatic.  Nose: Nose normal.  Mouth/Throat: No oropharyngeal exudate.  Eyes: Pupils are equal, round, and reactive to light. Conjunctivae and EOM are normal. Right eye exhibits no discharge. Left eye exhibits no discharge. No scleral icterus.  Neck: Normal range of motion. Neck supple. No JVD present. No tracheal deviation present. No thyromegaly present.  Cardiovascular: Normal rate, regular rhythm, normal heart sounds and intact distal pulses.   Pulmonary/Chest: Effort normal. No respiratory distress. She exhibits no tenderness.  Abdominal: Soft. She exhibits no distension and no mass. There is no tenderness. There is no guarding.  Musculoskeletal:  Right and left upper extremity no tenderness or deformity, no joint subluxation, no contractures atrophy or tremor. Normal muscle tone. Normal sensation and pulses no lymphadenopathy  Right lower extremity no tenderness or deformity, no joint subluxation no contracture atrophy or tremor normal muscle tone normal sensation normal pulses and no lymphadenopathy  Left hip is tender there is shortened external rotation deformity hip is reduced without subluxation there no contracture atrophy or tremor muscle tone normal sensation intact pulses normal no lymphadenopathy  Lymphadenopathy:    She has no cervical adenopathy.  Neurological: She is alert and oriented to person, place, and time. She displays normal reflexes. No cranial nerve deficit or sensory deficit. She  exhibits normal muscle tone. Coordination normal.  Skin: Skin is warm and dry. Capillary refill takes less than 2 seconds. No rash noted. She is not diaphoretic. No erythema. No pallor.  Psychiatric: She has a normal mood and affect. Her behavior is normal. Judgment and thought content normal.    Diagnosis left intertrochanteric hip fracture  Plan open treatment internal fixation left hip  The procedure has been fully reviewed with the patient; The risks and benefits of surgery have been discussed and explained and understood. Alternative treatment has also been reviewed, questions were encouraged and answered. The postoperative plan is also been reviewed.

## 2017-05-16 NOTE — Progress Notes (Signed)
PROGRESS NOTE    Judy Snyder  WRU:[id number] DOB: 1950/10/06 DOA: 05/15/2017 PCP: Barbra Sarks, MD    Brief Narrative:  66 year old female who presented after a mechanical fall. Patient does have the significant past medical history of systolic congestive heart failure, COPD, fibromyalgia, chronic back pain, dyslipidemia and hypertension. She fell down 3 steps on her stairs at home, landing on her left side, with no head trauma or loss of consciousness. She developed severe pain, unable to stand back on her feet. On her initial physical examination blood pressure 126/65, heart rate 83, respiratory rate 16, oxygen saturation 96%. Moist mucous membranes, lungs were clear to auscultation bilaterally, no wheezing, rales or rhonchi, heart S1-S2 present rhythmic, no gallops or murmurs, the abdomen was soft nontender nondistended, left lower extremity with decreased range of motion due to pain. Odium 136, potassium 3.4, chloride 103, bicarbonate 23, glucose 95, nightly 14, creatinine 1.12, white count 12.7, hemoglobin 12.7, platelets 222, INR 0.93, urine analysis negative for infection, urine drug screen negative, alcohol level less than 11. Head CT/neck CT no acute findings, right parietal scalp hematoma. Hip films with minimally displaced intertrochanteric fracture of the left hip. Chest film hypoinflated, no infiltrates, effusions or pneumothorax. EKG, sinus rhythm with a heart rate of 85 bpm, prolonged QT corrected 617.   Patient was admitted to the hospital with the working diagnosis acute left hip fracture.   Assessment & Plan:   Principal Problem:   Closed intertrochanteric fracture of left hip (HCC) Active Problems:   Benign essential HTN   Hypokalemia   COPD (chronic obstructive pulmonary disease) (HCC)   Prolonged Q-T interval on ECG   1. Acute left hip fracture. Will continue pain control and dvt prophylaxis, scheduled for surgical intervention today. Will follow with post operative  recommendations from orthopedics.   2. Hypertension. Slow trend down of systolic blood pressure down to 98. Will hold on verapamil and will decrease metoprolol to 25 mg bid from 50 mg bid. Continue close monitoring of blood pressure.   3. Hypokalemia. K has improved to 4,3, with serum cr at 1,15, will continue to follow on renal panel in am, avoid hypotension or nephrotoxic medications.   4. Prolonged QT. Will follow ekg, after correction or electrolytes. K at 4,3 and Mg at 2,0, avoid qt prolonging agents.  5. COPD. Stable with no signs of exacerbation, will continue oxymetry monitoring. Continue tiotropium.   DVT prophylaxis: scd  Code Status: full Family Communication:  I spoke with patient's son at the bedside and all questions were addressed Disposition Plan: home/ snf   Consultants:   Orthopedics  Procedures:     Antimicrobials:       Subjective: Patient with positive pain at the left hip, moderate to severe, worse with movement, improved with analgesics, no radiation, or associated nausea or vomiting, no dyspnea or chest pain.   Objective: Vitals:   05/15/17 2341 05/16/17 0015 05/16/17 0542 05/16/17 0902  BP: (!) 131/56 129/75 (!) 98/45   Pulse: 78 90 70   Resp: 17 18 18    Temp:  98.2 F (36.8 C) 98.1 F (36.7 C)   TempSrc:  Oral Oral   SpO2: 96% 96% (!) 71% 90%  Weight:  75.3 kg (165 lb 14.4 oz)    Height:        Intake/Output Summary (Last 24 hours) at 05/16/17 1148 Last data filed at 05/16/17 0300  Gross per 24 hour  Intake  410 ml  Output                0 ml  Net              410 ml   Filed Weights   05/15/17 1733 05/16/17 0015  Weight: 72.6 kg (160 lb) 75.3 kg (165 lb 14.4 oz)    Examination:   General: Not in pain or dyspnea, deconditioned Neurology: Awake and alert, non focal  E ENT: mild pallor, no icterus, oral mucosa moist Cardiovascular: No JVD. S1-S2 present, rhythmic, no gallops, rubs, or murmurs. No lower extremity  edema. Pulmonary: vesicular breath sounds bilaterally, adequate air movement, no wheezing, rhonchi or rales. Gastrointestinal. Abdomen flat, no organomegaly, non tender, no rebound or guarding Skin. No rashes Musculoskeletal: no joint deformities/ decreases range of motion on the left hip due to pain.      Data Reviewed: I have personally reviewed following labs and imaging studies  CBC:  Recent Labs Lab 05/15/17 2021 05/16/17 0527  WBC 12.7* 7.0  NEUTROABS 10.1*  --   HGB 12.7 11.8*  HCT 38.0 37.0  MCV 94.3 95.6  PLT 222 211   Basic Metabolic Panel:  Recent Labs Lab 05/15/17 2021 05/16/17 0527  NA 136 136  K 3.4* 4.3  CL 103 105  CO2 23 24  GLUCOSE 95 103*  BUN 14 15  CREATININE 1.12* 1.15*  CALCIUM 8.8* 8.5*  MG 2.0  --    GFR: Estimated Creatinine Clearance: 47.4 mL/min (A) (by C-G formula based on SCr of 1.15 mg/dL (H)). Liver Function Tests: No results for input(s): AST, ALT, ALKPHOS, BILITOT, PROT, ALBUMIN in the last 168 hours. No results for input(s): LIPASE, AMYLASE in the last 168 hours. No results for input(s): AMMONIA in the last 168 hours. Coagulation Profile:  Recent Labs Lab 05/15/17 2021  INR 0.93   Cardiac Enzymes: No results for input(s): CKTOTAL, CKMB, CKMBINDEX, TROPONINI in the last 168 hours. BNP (last 3 results) No results for input(s): PROBNP in the last 8760 hours. HbA1C: No results for input(s): HGBA1C in the last 72 hours. CBG: No results for input(s): GLUCAP in the last 168 hours. Lipid Profile: No results for input(s): CHOL, HDL, LDLCALC, TRIG, CHOLHDL, LDLDIRECT in the last 72 hours. Thyroid Function Tests: No results for input(s): TSH, T4TOTAL, FREET4, T3FREE, THYROIDAB in the last 72 hours. Anemia Panel: No results for input(s): VITAMINB12, FOLATE, FERRITIN, TIBC, IRON, RETICCTPCT in the last 72 hours.    Radiology Studies: I have reviewed all of the imaging during this hospital visit personally     Scheduled  Meds: . [START ON 05/17/2017] Influenza vac split quadrivalent PF  0.5 mL Intramuscular Tomorrow-1000  . metoprolol tartrate  50 mg Oral BID  . Milnacipran  100 mg Oral BID  . pantoprazole  40 mg Oral Daily  . senna  1 tablet Oral BID  . tiotropium  18 mcg Inhalation Daily  . verapamil  240 mg Oral QHS   Continuous Infusions: . 0.9 % NaCl with KCl 20 mEq / L 88 mL/hr at 05/16/17 0145     LOS: 1 day        Coralie KeensMauricio Daniel Brixon Zhen, MD [facility] Pager [phone]

## 2017-05-17 ENCOUNTER — Encounter (HOSPITAL_COMMUNITY): Payer: Self-pay | Admitting: Orthopedic Surgery

## 2017-05-17 DIAGNOSIS — S72002A Fracture of unspecified part of neck of left femur, initial encounter for closed fracture: Secondary | ICD-10-CM

## 2017-05-17 DIAGNOSIS — M79605 Pain in left leg: Secondary | ICD-10-CM

## 2017-05-17 LAB — CBC
HCT: 29.3 % — ABNORMAL LOW (ref 36.0–46.0)
Hemoglobin: 9.4 g/dL — ABNORMAL LOW (ref 12.0–15.0)
MCH: 31.4 pg (ref 26.0–34.0)
MCHC: 32.1 g/dL (ref 30.0–36.0)
MCV: 98 fL (ref 78.0–100.0)
PLATELETS: 205 10*3/uL (ref 150–400)
RBC: 2.99 MIL/uL — AB (ref 3.87–5.11)
RDW: 14.3 % (ref 11.5–15.5)
WBC: 14 10*3/uL — ABNORMAL HIGH (ref 4.0–10.5)

## 2017-05-17 LAB — BASIC METABOLIC PANEL
Anion gap: 7 (ref 5–15)
BUN: 19 mg/dL (ref 6–20)
CO2: 23 mmol/L (ref 22–32)
Calcium: 8.1 mg/dL — ABNORMAL LOW (ref 8.9–10.3)
Chloride: 103 mmol/L (ref 101–111)
Creatinine, Ser: 1.11 mg/dL — ABNORMAL HIGH (ref 0.44–1.00)
GFR, EST AFRICAN AMERICAN: 59 mL/min — AB (ref >60)
GFR, EST NON AFRICAN AMERICAN: 51 mL/min — AB (ref >60)
Glucose, Bld: 103 mg/dL — ABNORMAL HIGH (ref 65–99)
POTASSIUM: 4.7 mmol/L (ref 3.5–5.1)
SODIUM: 133 mmol/L — AB (ref 135–145)

## 2017-05-17 LAB — HIV ANTIBODY (ROUTINE TESTING W REFLEX): HIV SCREEN 4TH GENERATION: NONREACTIVE

## 2017-05-17 MED ORDER — ENSURE ENLIVE PO LIQD
237.0000 mL | Freq: Two times a day (BID) | ORAL | Status: DC
  Administered 2017-05-17 – 2017-05-20 (×7): 237 mL via ORAL

## 2017-05-17 NOTE — Clinical Social Work Note (Signed)
Clinical Social Work Assessment  Patient Details  Name: Judy Snyder MRN: [id number] Date of Birth: 08-Jul-1951  Date of referral:  05/17/17               Reason for consult:  Facility Placement                Permission sought to share information with:    Permission granted to share information::     Name::        Agency::     Relationship::     Contact Information:     Housing/Transportation Living arrangements for the past 2 months:  Apartment Source of Information:  Patient, Adult Children Patient Interpreter Needed:  None Criminal Activity/Legal Involvement Pertinent to Current Situation/Hospitalization:    Significant Relationships:  Adult Children, Spouse, Siblings Lives with:  Spouse Do you feel safe going back to the place where you live?  Yes Need for family participation in patient care:  No (Coment)  Care giving concerns: No care giving concerns identified.   Social Worker assessment / plan: Pt is a 66 year old female admitted with a L hip fracture. Pt had surgery yesterday and she will require SNF rehab at dc per PT. Met with pt to assess and assist with dc planning. Pt's sister, brother in law, and adult daughter present in the room at the time of CSW assessment with pt's consent. Pt reports that she lives with her husband in the basement apartment of the home occupied by her adult grandson. Pt has to navigate stairs down to the apartment. She states that she normally uses a cane for ambulation. She also has a walker at home if needed. Pt has been independent in ADL's prior to admission. Discussed PT recommendation for short term SNF rehab with pt. Pt expresses disappointment but also understanding. Provided pt with list of SNF options and discussed referral process. Pt requests referrals to [facility] and [facility]. Referrals started. CSW will follow and assist with dc planning.  Employment status:  Retired Forensic scientist:  Medicare PT  Recommendations:  [place] / Referral to community resources:     Patient/Family's Response to care: Pt open to CSW assistance with placement.  Patient/Family's Understanding of and Emotional Response to Diagnosis, Current Treatment, and Prognosis: Pt understands treatment recommendations and plan. She is disappointed in need for SNF rehab but she expresses understanding of the need for this level of care.  Emotional Assessment Appearance:  Appears stated age Attitude/Demeanor/Rapport:    Affect (typically observed):  Apprehensive, Calm, Pleasant Orientation:  Oriented to Self, Oriented to Place, Oriented to  Time, Oriented to Situation Alcohol / Substance use:    Psych involvement (Current and /or in the community):     Discharge Needs  Concerns to be addressed:    Readmission within the last 30 days:  No Current discharge risk:  None Barriers to Discharge:  No Barriers Identified   Shade Flood, LCSW 05/17/2017, 12:22 PM

## 2017-05-17 NOTE — Anesthesia Postprocedure Evaluation (Signed)
Anesthesia Post Note  Patient: Judy Snyder  Procedure(s) Performed: INTRAMEDULLARY (IM) NAIL INTERTROCHANTRIC (Left )  Patient location during evaluation: Nursing Unit Anesthesia Type: Spinal Level of consciousness: awake and alert Pain management: satisfactory to patient Vital Signs Assessment: post-procedure vital signs reviewed and stable Respiratory status: spontaneous breathing Cardiovascular status: stable Postop Assessment: no apparent nausea or vomiting, adequate PO intake and patient able to bend at knees Anesthetic complications: no     Last Vitals:  Vitals:   05/16/17 2252 05/17/17 0500  BP: (!) 92/29 (!) 91/37  Pulse: 80 95  Resp: 18 17  Temp: 37 C (!) 38.2 C  SpO2: 93% 91%    Last Pain:  Vitals:   05/17/17 1124  TempSrc:   PainSc: 10-Worst pain ever                 Khristian Seals

## 2017-05-17 NOTE — Progress Notes (Signed)
OT Cancellation Note  Patient Details Name: [name] MRN: [id number] DOB: 12/23/1950   Cancelled Treatment:    Reason Eval/Treat Not Completed: Patient at procedure or test/ unavailable (with IV nurse) Therapist will attempt evaluation later this date or 05/18/17 as schedule allows.    Shirlean MylarBethany H. Neoma Uhrich, MHA, OTR/L [phone]  05/17/2017, 9:46 AM

## 2017-05-17 NOTE — Progress Notes (Signed)
Subjective: 1 Day Post-Op Procedure(s) (LRB): INTRAMEDULLARY (IM) NAIL INTERTROCHANTRIC (Left) Patient reports pain as moderate.    Objective: Vital signs in last 24 hours: Temp:  [97.4 F (36.3 C)-100.7 F (38.2 C)] 100.7 F (38.2 C) (10/29 0500) Pulse Rate:  [66-95] 95 (10/29 0500) Resp:  [17-24] 17 (10/29 0500) BP: (83-118)/(27-92) 91/37 (10/29 0500) SpO2:  [90 %-97 %] 91 % (10/29 0500)  Intake/Output from previous day: 10/28 0701 - 10/29 0700 In: 3408.7 [P.O.:240; I.V.:3018.7; IV Piggyback:150] Out: 930 [Urine:630; Blood:300] Intake/Output this shift: No intake/output data recorded.   Recent Labs  05/15/17 2021 05/16/17 0527 05/17/17 0511  HGB 12.7 11.8* 9.4*    Recent Labs  05/16/17 0527 05/17/17 0511  WBC 7.0 14.0*  RBC 3.87 2.99*  HCT 37.0 29.3*  PLT 211 205    Recent Labs  05/16/17 0527 05/17/17 0511  NA 136 133*  K 4.3 4.7  CL 105 103  CO2 24 23  BUN 15 19  CREATININE 1.15* 1.11*  GLUCOSE 103* 103*  CALCIUM 8.5* 8.1*    Recent Labs  05/15/17 2021  INR 0.93    Neurologically intact Neurovascular intact Sensation intact distally Intact pulses distally Dorsiflexion/Plantar flexion intact dry deressing   Assessment/Plan: 1 Day Post-Op Procedure(s) (LRB): INTRAMEDULLARY (IM) NAIL INTERTROCHANTRIC (Left) Advance diet Up with therapy D/C IV fluids Determine discharge plan  Judy CanadaStanley Aireonna Snyder 05/17/2017, 8:15 AM

## 2017-05-17 NOTE — Clinical Social Work Placement (Signed)
   CLINICAL SOCIAL WORK PLACEMENT  NOTE  Date:  05/17/2017  Patient Details  Name: Judy Snyder MRN: [id number] Date of Birth: 01/21/1951  Clinical Social Work is seeking post-discharge placement for this patient at the   level of care (*CSW will initial, date and re-position this form in  chart as items are completed):      Patient/family provided with [facility] Clinical Social Work Department's list of facilities offering this level of care within the geographic area requested by the patient (or if unable, by the patient's family).      Patient/family informed of their freedom to choose among providers that offer the needed level of care, that participate in Medicare, Medicaid or managed care program needed by the patient, have an available bed and are willing to accept the patient.      Patient/family informed of [facility]'s ownership interest in [facility] and [facility], as well as of the fact that they are under no obligation to receive care at these facilities.  PASRR submitted to EDS on       PASRR number received on       Existing PASRR number confirmed on       FL2 transmitted to all facilities in geographic area requested by pt/family on       FL2 transmitted to all facilities within larger geographic area on       Patient informed that his/her managed care company has contracts with or will negotiate with certain facilities, including the following:            Patient/family informed of bed offers received.  Patient chooses bed at       Physician recommends and patient chooses bed at      Patient to be transferred to   on  .  Patient to be transferred to facility by       Patient family notified on   of transfer.  Name of family member notified:        PHYSICIAN       Additional Comment:    _______________________________________________ Elliot GaultKathleen Lauris Keepers, LCSW 05/17/2017, 12:28 PM

## 2017-05-17 NOTE — Evaluation (Signed)
Clinical/Bedside Swallow Evaluation Patient Details  Name: Judy Snyder MRN: [id number] Date of Birth: [birth year]  Today's Date: 05/17/2017 Time: SLP Start Time (ACUTE ONLY): 1330 SLP Stop Time (ACUTE ONLY): 1354 SLP Time Calculation (min) (ACUTE ONLY): 24 min  Past Medical History:  Past Medical History:  Diagnosis Date  . CHF (congestive heart failure) (HCC)   . COPD (chronic obstructive pulmonary disease) (HCC)   . Fibromyalgia   . High cholesterol   . Hypertension   . Vertigo    Past Surgical History:  Past Surgical History:  Procedure Laterality Date  . BACK SURGERY    . KNEE SURGERY    . SHOULDER SURGERY     HPI:  66 year old female who presented after a mechanical fall. Patient does have the significant past medical history of systolic congestive heart failure, COPD, fibromyalgia, chronic back pain, dyslipidemia and hypertension. She fell down 3 steps on her stairs at home, landing on her left side, with no head trauma or loss of consciousness. She developed severe pain, unable to stand back on her feet. On her initial physical examination blood pressure 126/65, heart rate 83, respiratory rate 16, oxygen saturation 96%. Moist mucous membranes, lungs were clear to auscultation bilaterally, no wheezing, rales or rhonchi, heart S1-S2 present rhythmic, no gallops or murmurs, the abdomen was soft nontender nondistended, left lower extremity with decreased range of motion due to pain. Odium 136, potassium 3.4, chloride 103, bicarbonate 23, glucose 95, nightly 14, creatinine 1.12, white count 12.7, hemoglobin 12.7, platelets 222, INR 0.93, urine analysis negative for infection, urine drug screen negative, alcohol level less than 11. Head CT/neck CT no acute findings, right parietal scalp hematoma. Hip films with minimally displaced intertrochanteric fracture of the left hip. Chest film hypoinflated, no infiltrates, effusions or pneumothorax. EKG, sinus rhythm with a heart rate of 85 bpm,  prolonged QT corrected 617. Pt had hip repair yesterday. She did vomit right after spinal anesthetic. BSE ordered due to Pt with report of difficulty swallowing eggs post surgery.    Assessment / Plan / Recommendation Clinical Impression  Pt denies previous history of dyshagia, however she does endorse previous GI work up for reflux (takes medication). Pt/spouse reports occasional problems with reflux. Pt presents with hoarse vocal quality, which spouse reports started yesterday following surgery. Chart review reveals that Pt vomited during surgery. Oral motor evaluation is otherwise WNL. Pt without overt signs of symptoms of aspiration, however does present with periodic throat clearing (present prior to po). Recommend continue diet as ordered and monitor Pt due to emesis during yesterday's procedure. Pt and spouse in agreement with plan of care. Pt/spouse education on standard reflux and aspiration precautions. Ok for regular textures with thin liquids and medications whole with water. SLP will follow during acute stay.  SLP Visit Diagnosis: Dysphagia, oropharyngeal phase (R13.12)    Aspiration Risk  Mild aspiration risk    Diet Recommendation Regular;Thin liquid   Liquid Administration via: Cup;Straw Medication Administration: Whole meds with liquid Supervision: Patient able to self feed Postural Changes: Remain upright for at least 30 minutes after po intake;Seated upright at 90 degrees    Other  Recommendations Oral Care Recommendations: Oral care BID Other Recommendations: Clarify dietary restrictions   Follow up Recommendations None      Frequency and Duration min 1 x/week  1 week       Prognosis Prognosis for Safe Diet Advancement: Good      Swallow Study   General Date of Onset: 05/15/17 HPI: 66 year old female  who presented after a mechanical fall. Patient does have the significant past medical history of systolic congestive heart failure, COPD, fibromyalgia, chronic back  pain, dyslipidemia and hypertension. She fell down 3 steps on her stairs at home, landing on her left side, with no head trauma or loss of consciousness. She developed severe pain, unable to stand back on her feet. On her initial physical examination blood pressure 126/65, heart rate 83, respiratory rate 16, oxygen saturation 96%. Moist mucous membranes, lungs were clear to auscultation bilaterally, no wheezing, rales or rhonchi, heart S1-S2 present rhythmic, no gallops or murmurs, the abdomen was soft nontender nondistended, left lower extremity with decreased range of motion due to pain. Odium 136, potassium 3.4, chloride 103, bicarbonate 23, glucose 95, nightly 14, creatinine 1.12, white count 12.7, hemoglobin 12.7, platelets 222, INR 0.93, urine analysis negative for infection, urine drug screen negative, alcohol level less than 11. Head CT/neck CT no acute findings, right parietal scalp hematoma. Hip films with minimally displaced intertrochanteric fracture of the left hip. Chest film hypoinflated, no infiltrates, effusions or pneumothorax. EKG, sinus rhythm with a heart rate of 85 bpm, prolonged QT corrected 617. Pt had hip repair yesterday. She did vomit right after spinal anesthetic. BSE ordered due to Pt with report of difficulty swallowing eggs post surgery.  Type of Study: Bedside Swallow Evaluation Previous Swallow Assessment: None on record Diet Prior to this Study: Regular;Thin liquids Temperature Spikes Noted: No Respiratory Status: Room air History of Recent Intubation: No Behavior/Cognition: Alert;Cooperative;Pleasant mood Oral Cavity Assessment: Within Functional Limits Oral Care Completed by SLP: No Oral Cavity - Dentition: Poor condition Vision: Functional for self-feeding Self-Feeding Abilities: Able to feed self Patient Positioning: Upright in bed Baseline Vocal Quality: Hoarse Volitional Cough: Strong Volitional Swallow: Able to elicit    Oral/Motor/Sensory Function Overall  Oral Motor/Sensory Function: Within functional limits   Ice Chips Ice chips: Not tested   Thin Liquid Thin Liquid: Within functional limits Presentation: Cup;Self Fed;Straw    Nectar Thick Nectar Thick Liquid: Not tested   Honey Thick Honey Thick Liquid: Not tested   Puree Puree: Within functional limits Presentation: Spoon   Solid   Thank you,  Havery MorosDabney Janda Cargo, CCC-SLP [phone]    Solid: Within functional limits Presentation: Self Fed        Judy Snyder 05/17/2017,1:59 PM

## 2017-05-17 NOTE — Addendum Note (Signed)
Addendum  created 05/17/17 1133 by Franco NonesYates, Damareon Lanni S, CRNA   Sign clinical note

## 2017-05-17 NOTE — Evaluation (Signed)
Physical Therapy Evaluation Patient Details Name: Judy Snyder MRN: [id number] DOB: 05/21/1951 Today's Date: 05/17/2017   History of Present Illness  Judy Snyder is a 66 y.o. female s/p INTRAMEDULLARY (IM) NAIL INTERTROCHANTRIC (Left) 05/16/17 with medical history significant of systolic CHF, COPD, fibromyalgia, chronic back pain, hyperlipidemia, hypertension, vertigo who is brought in to the emergency department after falling down 3 steps on her stairs at home, falling on her left side and injuring her left hip. She felt immediate pain and inability to bear weight on the extremity. She denies chest pain, palpitations, dizziness, diaphoresis, nausea, emesis or any other symptomatology prior to her fall. This was an accident per patient. She complains of frequent constipation, but denies abdominal pain, diarrhea, melena or hematochezia. Denies dysuria, frequency or hematuria.     Clinical Impression  Patient limited for taking steps due to c/o severe left hip pain when weight bearing, able to transfer to chair, bleeding noted from left wrist IV - RN notified.  Patient will benefit from continued physical therapy in hospital and recommended venue below to increase strength, balance, endurance for safe ADLs and gait.    Follow Up Recommendations SNF;Supervision/Assistance - 24 hour    Equipment Recommendations       Recommendations for Other Services       Precautions / Restrictions Precautions Precautions: Fall Restrictions Weight Bearing Restrictions: Yes LLE Weight Bearing: Weight bearing as tolerated      Mobility  Bed Mobility Overal bed mobility: Needs Assistance Bed Mobility: Supine to Sit;Sit to Supine     Supine to sit: Mod assist Sit to supine: Mod assist      Transfers Overall transfer level: Needs assistance   Transfers: Sit to/from Stand;Stand Pivot Transfers Sit to Stand: Mod assist Stand pivot transfers: Mod assist;Max assist           Ambulation/Gait Ambulation/Gait assistance: Mod assist;Max assist Ambulation Distance (Feet): 3 Feet Assistive device: Rolling walker (2 wheeled) Gait Pattern/deviations: Decreased step length - right;Decreased step length - left;Decreased stance time - left;Decreased stride length   Gait velocity interpretation: Below normal speed for age/gender General Gait Details: Patient limited to 4-5 steps during transfer to chair with difficuty advancing LLE due to increased hip pain  Stairs            Wheelchair Mobility    Modified Rankin (Stroke Patients Only)       Balance Overall balance assessment: Needs assistance Sitting-balance support: Bilateral upper extremity supported;Feet supported Sitting balance-Leahy Scale: Fair     Standing balance support: Bilateral upper extremity supported;During functional activity Standing balance-Leahy Scale: Poor                               Pertinent Vitals/Pain Pain Assessment: 0-10 Pain Score: 7  Pain Location: left hip Pain Descriptors / Indicators: Sharp;Pressure Pain Intervention(s): Limited activity within patient's tolerance;Monitored during session;Premedicated before session    Home Living Family/patient expects to be discharged to:: Private residence Living Arrangements: Spouse/significant other Available Help at Discharge: Family Type of Home: House Home Access: Stairs to enter Entrance Stairs-Rails: Right;Left;Can reach both Secretary/administratorntrance Stairs-Number of Steps: 3 Home Layout: Multi-level Home Equipment: Environmental consultantWalker - 2 wheels;Cane - single point;Shower seat - built in      Prior Function Level of Independence: Independent with assistive device(s)         Comments: Patient was ambulating with SPC prior to surgery     Hand Dominance  Extremity/Trunk Assessment   Upper Extremity Assessment Upper Extremity Assessment: Defer to OT evaluation    Lower Extremity Assessment Lower Extremity  Assessment: Generalized weakness;LLE deficits/detail LLE Deficits / Details: grossly 3/5    Cervical / Trunk Assessment Cervical / Trunk Assessment: Normal  Communication   Communication: No difficulties  Cognition Arousal/Alertness: Awake/alert Behavior During Therapy: WFL for tasks assessed/performed Overall Cognitive Status: Within Functional Limits for tasks assessed                                        General Comments      Exercises Total Joint Exercises Ankle Circles/Pumps: AROM;Strengthening;Both;5 reps;Supine Quad Sets: Supine;5 reps;Both;Strengthening;AROM Gluteal Sets: Supine;5 reps;Both;Strengthening;AROM Short Arc QuadBarbaraann Boys;Left;Strengthening;5 reps;Supine   Assessment/Plan    PT Assessment Patient needs continued PT services  PT Problem List Decreased strength;Decreased activity tolerance;Decreased balance;Decreased mobility       PT Treatment Interventions Gait training;Stair training;Functional mobility training;Therapeutic activities;Therapeutic exercise;Patient/family education    PT Goals (Current goals can be found in the Care Plan section)  Acute Rehab PT Goals Patient Stated Goal: Return home after rehab PT Goal Formulation: With patient/family Time For Goal Achievement: 05/27/17 Potential to Achieve Goals: Good    Frequency 7X/week   Barriers to discharge        Co-evaluation               AM-PAC PT "6 Clicks" Daily Activity  Outcome Measure Difficulty turning over in bed (including adjusting bedclothes, sheets and blankets)?: Unable Difficulty moving from lying on back to sitting on the side of the bed? : Unable Difficulty sitting down on and standing up from a chair with arms (e.g., wheelchair, bedside commode, etc,.)?: Unable Help needed moving to and from a bed to chair (including a wheelchair)?: A Lot Help needed walking in hospital room?: A Lot Help needed climbing 3-5 steps with a railing? : Total 6 Click  Score: 8    End of Session Equipment Utilized During Treatment: Gait belt Activity Tolerance: Patient tolerated treatment well;Patient limited by pain Patient left: in chair;with call bell/phone within reach;with family/visitor present Nurse Communication: Mobility status PT Visit Diagnosis: Unsteadiness on feet (R26.81);Other abnormalities of gait and mobility (R26.89);Muscle weakness (generalized) (M62.81)    Time: 1021-1050 PT Time Calculation (min) (ACUTE ONLY): 29 min   Charges:   PT Evaluation $PT Eval Low Complexity: 1 Low PT Treatments $Therapeutic Activity: 23-37 mins   PT G Codes:        2:28 PM, 06-05-2017 Ocie Bob, MPT Physical Therapist with [facility] [phone] office [phone] mobile phone

## 2017-05-17 NOTE — Progress Notes (Signed)
PROGRESS NOTE    Judy Snyder  ZOX:[id number] DOB: 09/18/1950 DOA: 05/15/2017 PCP: Barbra Sarks, MD    Brief Narrative:  66 year old female who presented after a mechanical fall. Patient does have the significant past medical history of systolic congestive heart failure, COPD, fibromyalgia, chronic back pain, dyslipidemia and hypertension. She fell down 3 steps on her stairs at home, landing on her left side, with no head trauma or loss of consciousness. She developed severe pain, unable to stand back on her feet. On her initial physical examination blood pressure 126/65, heart rate 83, respiratory rate 16, oxygen saturation 96%. Moist mucous membranes, lungs were clear to auscultation bilaterally, no wheezing, rales or rhonchi, heart S1-S2 present rhythmic, no gallops or murmurs, the abdomen was soft nontender nondistended, left lower extremity with decreased range of motion due to pain. Odium 136, potassium 3.4, chloride 103, bicarbonate 23, glucose 95, nightly 14, creatinine 1.12, white count 12.7, hemoglobin 12.7, platelets 222, INR 0.93, urine analysis negative for infection, urine drug screen negative, alcohol level less than 11. Head CT/neck CT no acute findings, right parietal scalp hematoma. Hip films with minimally displaced intertrochanteric fracture of the left hip. Chest film hypoinflated, no infiltrates, effusions or pneumothorax. EKG, sinus rhythm with a heart rate of 85 bpm, prolonged QT corrected 617.   Patient was admitted to the hospital with the working diagnosis acute left hip fracture.   Assessment & Plan:   Principal Problem:   Closed intertrochanteric fracture of left hip (HCC) Active Problems:   Benign essential HTN   Hypokalemia   COPD (chronic obstructive pulmonary disease) (HCC)   Prolonged Q-T interval on ECG   1. Acute left hip fracture sp intramedullary nail. Pain control with hydromorphine and oxycodone, continue dvt prophylaxis, out of bed with physical  therapy. Continue to follow post op recommendations from orthopedic service.   2. Hypotension. Blood pressure 80 to 90 systolic, will hold on all antihypertensive agents for now and will continue close monitoring,   3. Hypokalemia. Serum K at 4,7 with preserved renal function with cr at 1,11. Will follow on renal panel in am, patient tolerating po well.   4. Prolonged QT. Will avoid qt prolonging medications, will follow on ekg.   5. COPD. With no signs of exacerbation, continue oxymetry monitoring. Inhaled anticholinergic with tiotropium.   DVT prophylaxis: scd  Code Status: full Family Communication:  I spoke with patient's husband at the bedside and all questions were addressed Disposition Plan: home/ snf   Consultants:   Orthopedics  Procedures:     Antimicrobials:        Subjective: Patient ambulating with physical therapy, positive pain on the left hip, moderate in intensity, worse with movement, no radiation or associated symptoms, improved with analgesics.   Objective: Vitals:   05/16/17 1951 05/16/17 2051 05/16/17 2252 05/17/17 0500  BP: (!) 83/47 (!) 95/27 (!) 92/29 (!) 91/37  Pulse: 78 79 80 95  Resp: 19 18 18 17   Temp: 98.6 F (37 C) 98.7 F (37.1 C) 98.6 F (37 C) (!) 100.7 F (38.2 C)  TempSrc: Oral Oral Oral Oral  SpO2: 94% 95% 93% 91%  Weight:      Height:        Intake/Output Summary (Last 24 hours) at 05/17/17 1121 Last data filed at 05/17/17 0900  Gross per 24 hour  Intake          3648.67 ml  Output  930 ml  Net          2718.67 ml   Filed Weights   05/15/17 1733 05/16/17 0015  Weight: 72.6 kg (160 lb) 75.3 kg (165 lb 14.4 oz)    Examination:   General: Not in pain or dyspnea, deconditioned Neurology: Awake and alert, non focal  E ENT: mild pallor, no icterus, oral mucosa moist Cardiovascular: No JVD. S1-S2 present, rhythmic, no gallops, rubs, or murmurs. No lower extremity edema. Pulmonary: vesicular breath  sounds bilaterally, adequate air movement, no wheezing, rhonchi or rales. Mild decreased breath sounds at bases.  Gastrointestinal. Abdomen flat, no organomegaly, non tender, no rebound or guarding Skin. No rashes Musculoskeletal: no joint deformities     Data Reviewed: I have personally reviewed following labs and imaging studies  CBC:  Recent Labs Lab 05/15/17 2021 05/16/17 0527 05/17/17 0511  WBC 12.7* 7.0 14.0*  NEUTROABS 10.1*  --   --   HGB 12.7 11.8* 9.4*  HCT 38.0 37.0 29.3*  MCV 94.3 95.6 98.0  PLT 222 211 205   Basic Metabolic Panel:  Recent Labs Lab 05/15/17 2021 05/16/17 0527 05/17/17 0511  NA 136 136 133*  K 3.4* 4.3 4.7  CL 103 105 103  CO2 23 24 23   GLUCOSE 95 103* 103*  BUN 14 15 19   CREATININE 1.12* 1.15* 1.11*  CALCIUM 8.8* 8.5* 8.1*  MG 2.0  --   --    GFR: Estimated Creatinine Clearance: 49.1 mL/min (A) (by C-G formula based on SCr of 1.11 mg/dL (H)). Liver Function Tests: No results for input(s): AST, ALT, ALKPHOS, BILITOT, PROT, ALBUMIN in the last 168 hours. No results for input(s): LIPASE, AMYLASE in the last 168 hours. No results for input(s): AMMONIA in the last 168 hours. Coagulation Profile:  Recent Labs Lab 05/15/17 2021  INR 0.93   Cardiac Enzymes: No results for input(s): CKTOTAL, CKMB, CKMBINDEX, TROPONINI in the last 168 hours. BNP (last 3 results) No results for input(s): PROBNP in the last 8760 hours. HbA1C: No results for input(s): HGBA1C in the last 72 hours. CBG: No results for input(s): GLUCAP in the last 168 hours. Lipid Profile: No results for input(s): CHOL, HDL, LDLCALC, TRIG, CHOLHDL, LDLDIRECT in the last 72 hours. Thyroid Function Tests: No results for input(s): TSH, T4TOTAL, FREET4, T3FREE, THYROIDAB in the last 72 hours. Anemia Panel: No results for input(s): VITAMINB12, FOLATE, FERRITIN, TIBC, IRON, RETICCTPCT in the last 72 hours.    Radiology Studies: I have reviewed all of the imaging during  this hospital visit personally     Scheduled Meds: . aspirin EC  325 mg Oral Q breakfast  . metoprolol tartrate  25 mg Oral BID  . Milnacipran  100 mg Oral BID  . pantoprazole  40 mg Oral Daily  . senna  1 tablet Oral BID  . tiotropium  18 mcg Inhalation Daily   Continuous Infusions: . sodium chloride 100 mL/hr at 05/17/17 0651     LOS: 2 days        Coralie KeensMauricio Daniel Arrien, MD [facility] Pager [phone]

## 2017-05-17 NOTE — NC FL2 (Signed)
[place] MEDICAID FL2 LEVEL OF CARE SCREENING TOOL     IDENTIFICATION  Patient Name: Judy Snyder Birthdate: 07/02/1951 Sex: female Admission Date (Current Location): 05/15/2017  [facility] [place]   Facility and Address:  [facility],  [address], [place] [id number]      Provider Number: [id number]  Attending Physician Name and Address:  Coralie KeensArrien, Mauricio Daniel,*  Relative Name and Phone Number:       Current Level of Care: Hospital Recommended Level of Care: Skilled Nursing Facility Prior Approval Number:    Date Approved/Denied:   PASRR Number: [id number] [id number]  Discharge Plan: SNF    Current Diagnoses: Patient Active Problem List   Diagnosis Date Noted  . Prolonged Q-T interval on ECG 05/16/2017  . Closed intertrochanteric fracture of left hip (HCC) 05/15/2017  . Hypokalemia 05/15/2017  . COPD (chronic obstructive pulmonary disease) (HCC) 05/15/2017  . Syncope 05/13/2014  . Benign essential HTN 05/13/2014  . Fall 05/12/2014  . Dehydration 05/12/2014  . Orthostatic hypotension 05/12/2014    Orientation RESPIRATION BLADDER Height & Weight     Self, Time, Situation, Place  Normal Continent Weight: 165 lb 14.4 oz (75.3 kg) Height:  5\' 3"  (160 cm)  BEHAVIORAL SYMPTOMS/MOOD NEUROLOGICAL BOWEL NUTRITION STATUS      Continent Diet (Regular)  AMBULATORY STATUS COMMUNICATION OF NEEDS Skin   Extensive Assist Verbally Surgical wounds (L hip)                       Personal Care Assistance Level of Assistance  Bathing, Feeding, Dressing Bathing Assistance: Maximum assistance Feeding assistance: Independent Dressing Assistance: Limited assistance (Needs lower body dressing assistance)     Functional Limitations Info  Sight, Hearing, Speech Sight Info: Adequate Hearing Info: Adequate Speech Info: Adequate    SPECIAL CARE FACTORS FREQUENCY  PT (By licensed PT)     PT Frequency: 5 times a week               Contractures Contractures Info: Not present    Additional Factors Info  Code Status, Allergies Code Status Info: Full Allergies Info: None           Current Medications (05/17/2017):  This is the current hospital active medication list Current Facility-Administered Medications  Medication Dose Route Frequency Provider Last Rate Last Dose  . 0.9 %  sodium chloride infusion   Intravenous Continuous Vickki HearingHarrison, Stanley E, MD 100 mL/hr at 05/17/17 [phone]    . acetaminophen (TYLENOL) tablet 650 mg  650 mg Oral Q6H PRN Vickki HearingHarrison, Stanley E, MD       Or  . acetaminophen (TYLENOL) suppository 650 mg  650 mg Rectal Q6H PRN Vickki HearingHarrison, Stanley E, MD      . alum & mag hydroxide-simeth (MAALOX/MYLANTA) 200-200-20 MG/5ML suspension 30 mL  30 mL Oral Q4H PRN Vickki HearingHarrison, Stanley E, MD   30 mL at 05/16/17 2105  . aspirin EC tablet 325 mg  325 mg Oral Q breakfast Vickki HearingHarrison, Stanley E, MD   325 mg at 05/17/17 0914  . HYDROcodone-acetaminophen (NORCO/VICODIN) 5-325 MG per tablet 1-2 tablet  1-2 tablet Oral Q6H PRN Vickki HearingHarrison, Stanley E, MD   2 tablet at 05/17/17 1124  . HYDROmorphone (DILAUDID) injection 0.5 mg  0.5 mg Intravenous Q2H PRN Bobette Mortiz, David Manuel, MD   0.5 mg at 05/17/17 [id number]  . lubiprostone (AMITIZA) capsule 24 mcg  24 mcg Oral Daily PRN Bobette Mortiz, David Manuel, MD      . menthol-cetylpyridinium (CEPACOL)  lozenge 3 mg  1 lozenge Oral PRN Vickki Hearing, MD       Or  . phenol (CHLORASEPTIC) mouth spray 1 spray  1 spray Mouth/Throat PRN Vickki Hearing, MD      . metoCLOPramide (REGLAN) tablet 5-10 mg  5-10 mg Oral Q8H PRN Vickki Hearing, MD       Or  . metoCLOPramide (REGLAN) injection 5-10 mg  5-10 mg Intravenous Q8H PRN Vickki Hearing, MD   5 mg at 05/16/17 1644  . metoprolol tartrate (LOPRESSOR) tablet 25 mg  25 mg Oral BID Arrien, York Ram, MD      . Milnacipran [facility]) tablet TABS 100 mg  100 mg Oral BID Bobette Mo, MD   100 mg at 05/17/17 0914  . oxyCODONE  (Oxy IR/ROXICODONE) immediate release tablet 5-10 mg  5-10 mg Oral Q4H PRN Vickki Hearing, MD   5 mg at 05/17/17 0914  . pantoprazole (PROTONIX) EC tablet 40 mg  40 mg Oral Daily Bobette Mo, MD   40 mg at 05/17/17 0914  . prochlorperazine (COMPAZINE) injection 5 mg  5 mg Intravenous Q4H PRN Bobette Mo, MD      . senna [facility]) tablet 8.6 mg  1 tablet Oral BID Bobette Mo, MD   8.6 mg at 05/17/17 1610  . tiotropium (SPIRIVA) inhalation capsule 18 mcg  18 mcg Inhalation Daily Bobette Mo, MD   18 mcg at 05/16/17 9604     Discharge Medications: Please see discharge summary for a list of discharge medications.  Relevant Imaging Results:  Relevant Lab Results:   Additional Information SSN: [id number]  Elliot Gault, LCSW

## 2017-05-17 NOTE — Progress Notes (Signed)
Initial Nutrition Assessment  DOCUMENTATION CODES:      INTERVENTION:  Regular diet   Snacks qhs  NO BM since 10/21???  NUTRITION DIAGNOSIS:   Increased nutrient needs related to  (acute injury (fx) ) as evidenced by estimated needs.  GOAL:   Patient will meet greater than or equal to 90% of their needs   MONITOR:  Po intake, labs and wt trends     REASON FOR ASSESSMENT:   Consult Hip fracture protocol  ASSESSMENT:  Patient is a 66 y.o. female who fell at home an fractured her left hip. She is s/p surgery yesterday and has a hx CHF, HTN, HLD, COPD.  The patient describes appetite as "ok". She is eating well according to documented meal intake and independent with feeding. Provided a list of foods including to good sources of protein, foods that contain vitmain C and A as well as Zinc. Encouraged regular nutrition  intake and include protein sources each meal at least 3-4 times daily. Her weight hx is stable and nutrition exam is unremarkable.   Recent Labs Lab 05/15/17 2021 05/16/17 0527 05/17/17 0511  NA 136 136 133*  K 3.4* 4.3 4.7  CL 103 105 103  CO2 23 24 23   BUN 14 15 19   CREATININE 1.12* 1.15* 1.11*  CALCIUM 8.8* 8.5* 8.1*  MG 2.0  --   --   GLUCOSE 95 103* 103*   Labs: sodium 133 Meds: PPI, Senokot  NUTRITION - FOCUSED PHYSICAL EXAM:    Most Recent Value  Orbital Region  No depletion  Upper Arm Region  No depletion  Buccal Region  No depletion  Temple Region  No depletion  Clavicle Bone Region  Mild depletion  Clavicle and Acromion Bone Region  No depletion  Scapular Bone Region  Unable to assess  Dorsal Hand  Mild depletion  Patellar Region  No depletion  Anterior Thigh Region  No depletion  Posterior Calf Region  No depletion  Edema (RD Assessment)  None  Hair  Reviewed  Eyes  Reviewed  Mouth  Reviewed  Skin  Reviewed  Nails  Reviewed      Diet Order:  Diet regular Room service appropriate? Yes; Fluid consistency: Thin PO: 75% of  breakfast this morning. Feeds herself.   EDUCATION NEEDS: Provided Prom[employer] Healing Handout and reviewed with her and family present. Education needs have been addressed  Skin:  Surgical incision- s/p IM nailing left intertrochanteric    Last BM:  10/21   Height:   Ht Readings from Last 1 Encounters:  05/15/17 5\' 3"  (1.6 m)    Weight:   Wt Readings from Last 1 Encounters:  05/16/17 165 lb 14.4 oz (75.3 kg)    Ideal Body Weight:  52 kg  BMI:  Body mass index is 29.39 kg/m.  Estimated Nutritional Needs:   Kcal:  1500-1600   Protein:  82-90 gr  Fluid:  >1500 ml daily   Royann ShiversLynn Amarria Andreasen MS,RD,CSG,LDN Office: [phone] Pager: [phone]

## 2017-05-18 ENCOUNTER — Inpatient Hospital Stay (HOSPITAL_COMMUNITY): Payer: Medicare HMO

## 2017-05-18 ENCOUNTER — Encounter (HOSPITAL_COMMUNITY): Payer: Self-pay | Admitting: Orthopedic Surgery

## 2017-05-18 LAB — CBC
HEMATOCRIT: 24.2 % — AB (ref 36.0–46.0)
HEMOGLOBIN: 8 g/dL — AB (ref 12.0–15.0)
MCH: 31.4 pg (ref 26.0–34.0)
MCHC: 33.1 g/dL (ref 30.0–36.0)
MCV: 94.9 fL (ref 78.0–100.0)
Platelets: 151 10*3/uL (ref 150–400)
RBC: 2.55 MIL/uL — AB (ref 3.87–5.11)
RDW: 14.2 % (ref 11.5–15.5)
WBC: 6.6 10*3/uL (ref 4.0–10.5)

## 2017-05-18 LAB — BASIC METABOLIC PANEL
Anion gap: 3 — ABNORMAL LOW (ref 5–15)
BUN: 14 mg/dL (ref 6–20)
CHLORIDE: 102 mmol/L (ref 101–111)
CO2: 27 mmol/L (ref 22–32)
CREATININE: 0.76 mg/dL (ref 0.44–1.00)
Calcium: 8.1 mg/dL — ABNORMAL LOW (ref 8.9–10.3)
GFR calc Af Amer: >60 mL/min (ref >60)
GFR calc non Af Amer: >60 mL/min (ref >60)
GLUCOSE: 110 mg/dL — AB (ref 65–99)
POTASSIUM: 4.1 mmol/L (ref 3.5–5.1)
SODIUM: 132 mmol/L — AB (ref 135–145)

## 2017-05-18 MED ORDER — ONDANSETRON HCL 4 MG PO TABS
4.0000 mg | ORAL_TABLET | ORAL | Status: DC | PRN
  Administered 2017-05-18: 4 mg via ORAL
  Filled 2017-05-18: qty 1

## 2017-05-18 NOTE — Plan of Care (Signed)
Problem: Education: Goal: Knowledge of [place] [facility] Education information/materials will improve Outcome: Progressing Discussed general education  Information/materials with patient/family member-verbalized understanding.

## 2017-05-18 NOTE — Progress Notes (Signed)
Patient ID: Judy Snyder, female   DOB: 11/29/1950, 66 y.o.   MRN: [id number]  Postop day 2 surgery was Sunday  BP 131/64 (BP Location: Right Arm)   Pulse 94   Temp 98.8 F (37.1 C) (Oral)   Resp 18   Ht 5\' 3"  (1.6 m)   Wt 165 lb 14.4 oz (75.3 kg)   SpO2 96%   BMI 29.39 kg/m   CBC Latest Ref Rng & Units 05/18/2017 05/17/2017 05/16/2017  WBC 4.0 - 10.5 K/uL 6.6 14.0(H) 7.0  Hemoglobin 12.0 - 15.0 g/dL 8.0(L) 9.4(L) 11.8(L)  Hematocrit 36.0 - 46.0 % 24.2(L) 29.3(L) 37.0  Platelets 150 - 400 K/uL 151 205 211    BMP Latest Ref Rng & Units 05/18/2017 05/17/2017 05/16/2017  Glucose 65 - 99 mg/dL 409(W110(H) 119(J103(H) 478(G103(H)  BUN 6 - 20 mg/dL 14 19 15   Creatinine 0.44 - 1.00 mg/dL 9.560.76 2.13(Y1.11(H) 8.65(H1.15(H)  Sodium 135 - 145 mmol/L 132(L) 133(L) 136  Potassium 3.5 - 5.1 mmol/L 4.1 4.7 4.3  Chloride 101 - 111 mmol/L 102 103 105  CO2 22 - 32 mmol/L 27 23 24   Calcium 8.9 - 10.3 mg/dL 8.1(L) 8.1(L) 8.5(L)    The patient reports being tired and looks pale  The patient has a cardiac history her hemoglobin is 8  During surgery we did have an excessive amount of bleeding and she would benefit from one or 2 units of packed red cells. I've discussed this with her. She has an IV issue so I will order a PICC line to be placed followed by transfusion.

## 2017-05-18 NOTE — Consult Note (Signed)
   [facility]   05/18/2017  Judy Snyder 12/29/1950 [id number]   Patient screened for potential [facility] Care Management services. Patient is on the [facility] registry as a benefit of their [employer] medicare . Electronic medical record reveals patient's discharge plan is STR. [facility] Care Management services not appropriate at this time. If patient's post hospital needs change please place a [facility] Care Management consult. For questions please contact:   Shonte Soderlund RN, BSN [facility]  Hospital Liaison  [phone]) Business Mobile [phone]) Toll free office

## 2017-05-18 NOTE — Progress Notes (Signed)
PROGRESS NOTE    Judy Snyder  UJW:[id number] DOB: 06/07/1951 DOA: 05/15/2017 PCP: [name], [name], MD    Brief Narrative:  66 year old female who presented after a mechanical fall. Patient does have the significant past medical history of systolic congestive heart failure, COPD, fibromyalgia, chronic back pain, dyslipidemia and hypertension. She fell down 3 steps on her stairs at home, landing on her left side, with no head trauma or loss of consciousness. She developed severe pain, unable to stand back on her feet. On her initial physical examination blood pressure 126/65, heart rate 83, respiratory rate 16, oxygen saturation 96%. Moist mucous membranes, lungs were clear to auscultation bilaterally, no wheezing, rales or rhonchi, heart S1-S2 present rhythmic, no gallops or murmurs, the abdomen was soft nontender nondistended, left lower extremity with decreased range of motion due to pain. Odium 136, potassium 3.4, chloride 103, bicarbonate 23, glucose 95, nightly 14, creatinine 1.12, white count 12.7, hemoglobin 12.7, platelets 222, INR 0.93, urine analysis negative for infection, urine drug screen negative, alcohol level less than 11. Head CT/neck CT no acute findings, right parietal scalp hematoma. Hip films with minimally displaced intertrochanteric fracture of the left hip. Chest film hypoinflated, no infiltrates, effusions or pneumothorax. EKG, sinus rhythm with a heart rate of 85 bpm, prolonged QT corrected 617.   Patient was admitted to the hospital with the working diagnosis acute left hip fracture.   Assessment & Plan:   Principal Problem:   Closed intertrochanteric fracture of left hip (HCC) Active Problems:   Benign essential HTN   Hypokalemia   COPD (chronic obstructive pulmonary disease) (HCC)   Prolonged Q-T interval on ECG  1. Acute left hip fracture sp intramedullary nail. Will continue pain control with hydromorphone and oxycodone. Physical therapy and dvt prophylaxis.  Patient will need SNF at discharge.   2. Hypotension. Improved blood pressure with systolic 130, will continue to hold on antihypertensives.   3. Hypokalemia. It has resolved with serum K at 4,1 and preserved renal function.   4. Prolonged QT. Will check ekg today and will avoid qt prolonging agents.   5. COPD. Stable with no signs of exacerbation. On Inhaled anticholinergic with tiotropium, tolerating well.   DVT prophylaxis:scd Code Status:full Family Communication:I spoke with patient's husband at the bedside and all questions were addressed Disposition Plan:home/ snf   Consultants:  Orthopedics  Procedures:    Antimicrobials:     Subjective: Patient with pain at the left hip and left knee, worse with ambulation, moderate to severe, sharp in nature, improved with analgesics, working with physical therapy.   Objective: Vitals:   05/17/17 2032 05/18/17 0554 05/18/17 0717 05/18/17 0842  BP: (!) 95/50 121/61    Pulse: 80 (!) 101    Resp: 20 20    Temp: 98.6 F (37 C) 99.3 F (37.4 C)    TempSrc: Oral Oral    SpO2: 93% (!) 79% 99% 95%  Weight:      Height:        Intake/Output Summary (Last 24 hours) at 05/18/17 0938 Last data filed at 05/17/17 2223  Gross per 24 hour  Intake              360 ml  Output              475 ml  Net             -115 ml   Filed Weights   05/15/17 1733 05/16/17 0015  Weight: 72.6 kg (160 lb) 75.3  kg (165 lb 14.4 oz)    Examination:   General: Not in pain or dyspnea, deconditioned Neurology: Awake and alert, non focal  E ENT: mild pallor, no icterus, oral mucosa moist Cardiovascular: No JVD. S1-S2 present, rhythmic, no gallops, rubs, or murmurs. No lower extremity edema. Pulmonary: vesicular breath sounds bilaterally, adequate air movement, no wheezing, rhonchi or rales. Gastrointestinal. Abdomen flat, no organomegaly, non tender, no rebound or guarding Skin. No rashes Musculoskeletal: left knee,  deformities, no erythema or increased local temperature.      Data Reviewed: I have personally reviewed following labs and imaging studies  CBC:  Recent Labs Lab 05/15/17 2021 05/16/17 0527 05/17/17 0511 05/18/17 0608  WBC 12.7* 7.0 14.0* 6.6  NEUTROABS 10.1*  --   --   --   HGB 12.7 11.8* 9.4* 8.0*  HCT 38.0 37.0 29.3* 24.2*  MCV 94.3 95.6 98.0 94.9  PLT 222 211 205 151   Basic Metabolic Panel:  Recent Labs Lab 05/15/17 2021 05/16/17 0527 05/17/17 0511 05/18/17 0608  NA 136 136 133* 132*  K 3.4* 4.3 4.7 4.1  CL 103 105 103 102  CO2 23 24 23 27   GLUCOSE 95 103* 103* 110*  BUN 14 15 19 14   CREATININE 1.12* 1.15* 1.11* 0.76  CALCIUM 8.8* 8.5* 8.1* 8.1*  MG 2.0  --   --   --    GFR: Estimated Creatinine Clearance: 68.2 mL/min (by C-G formula based on SCr of 0.76 mg/dL). Liver Function Tests: No results for input(s): AST, ALT, ALKPHOS, BILITOT, PROT, ALBUMIN in the last 168 hours. No results for input(s): LIPASE, AMYLASE in the last 168 hours. No results for input(s): AMMONIA in the last 168 hours. Coagulation Profile:  Recent Labs Lab 05/15/17 2021  INR 0.93   Cardiac Enzymes: No results for input(s): CKTOTAL, CKMB, CKMBINDEX, TROPONINI in the last 168 hours. BNP (last 3 results) No results for input(s): PROBNP in the last 8760 hours. HbA1C: No results for input(s): HGBA1C in the last 72 hours. CBG: No results for input(s): GLUCAP in the last 168 hours. Lipid Profile: No results for input(s): CHOL, HDL, LDLCALC, TRIG, CHOLHDL, LDLDIRECT in the last 72 hours. Thyroid Function Tests: No results for input(s): TSH, T4TOTAL, FREET4, T3FREE, THYROIDAB in the last 72 hours. Anemia Panel: No results for input(s): VITAMINB12, FOLATE, FERRITIN, TIBC, IRON, RETICCTPCT in the last 72 hours.    Radiology Studies: I have reviewed all of the imaging during this hospital visit personally     Scheduled Meds: . aspirin EC  325 mg Oral Q breakfast  . feeding  supplement (ENSURE ENLIVE)  237 mL Oral BID BM  . Milnacipran  100 mg Oral BID  . pantoprazole  40 mg Oral Daily  . senna  1 tablet Oral BID  . tiotropium  18 mcg Inhalation Daily   Continuous Infusions:   LOS: 3 days        Deniz Hannan Annett Gula, MD [facility] Pager [phone]

## 2017-05-18 NOTE — Progress Notes (Addendum)
Pt complains of nausea.  Text Dr Ella JubileeArrien who gave verbal orders for Zofran.

## 2017-05-18 NOTE — Plan of Care (Signed)
Problem: Safety: Goal: Ability to remain free from injury will improve Outcome: Progressing Pt's ability to remain free from injury will improve as physical activity level and cognitive level  Improves.

## 2017-05-18 NOTE — Progress Notes (Signed)
  Speech Language Pathology Treatment: Dysphagia  Patient Details Name: Judy Snyder MRN: [id number] DOB: 06/20/1951 Today's Date: 05/18/2017 Time: [id number] SLP Time Calculation (min) (ACUTE ONLY): 25 min  Assessment / Plan / Recommendation Clinical Impression  Pt seen at bedside with dinner meal and caregiver present. Caregiver reports that Pt sat up in chair for an hour today and became nauseous. PO intake has been limited. Pt reports h/o C-Spine fusion with anterior approach ~8 years ago and had some swallowing problems post. She states that she has to be careful when consuming certain foods (rice). Pt self presented dinner meal and thin liquids via straw sips with occasional throat clearing observed. Vocal quality is improved today, but still some degree of hoarseness. RN reports lung sounds are clear, Pt without SOB. Continue diet as ordered.    HPI HPI: 66 year old female who presented after a mechanical fall. Patient does have the significant past medical history of systolic congestive heart failure, COPD, fibromyalgia, chronic back pain, dyslipidemia and hypertension. She fell down 3 steps on her stairs at home, landing on her left side, with no head trauma or loss of consciousness. She developed severe pain, unable to stand back on her feet. On her initial physical examination blood pressure 126/65, heart rate 83, respiratory rate 16, oxygen saturation 96%. Moist mucous membranes, lungs were clear to auscultation bilaterally, no wheezing, rales or rhonchi, heart S1-S2 present rhythmic, no gallops or murmurs, the abdomen was soft nontender nondistended, left lower extremity with decreased range of motion due to pain. Odium 136, potassium 3.4, chloride 103, bicarbonate 23, glucose 95, nightly 14, creatinine 1.12, white count 12.7, hemoglobin 12.7, platelets 222, INR 0.93, urine analysis negative for infection, urine drug screen negative, alcohol level less than 11. Head CT/neck CT no acute  findings, right parietal scalp hematoma. Hip films with minimally displaced intertrochanteric fracture of the left hip. Chest film hypoinflated, no infiltrates, effusions or pneumothorax. EKG, sinus rhythm with a heart rate of 85 bpm, prolonged QT corrected 617. Pt had hip repair yesterday. She did vomit right after spinal anesthetic. BSE ordered due to Pt with report of difficulty swallowing eggs post surgery.       SLP Plan  Continue with current plan of care       Recommendations  Diet recommendations: Regular;Thin liquid Liquids provided via: Cup;Straw Medication Administration: Whole meds with liquid Supervision: Patient able to self feed Compensations: Small sips/bites Postural Changes and/or Swallow Maneuvers: Seated upright 90 degrees;Upright 30-60 min after meal                Oral Care Recommendations: Oral care BID Follow up Recommendations: None Plan: Continue with current plan of care       Thank you,  Havery MorosDabney Stefanos Haynesworth, CCC-SLP [phone]                 Porfirio Bollier 05/18/2017, 6:13 PM

## 2017-05-18 NOTE — Progress Notes (Signed)
Physical Therapy Treatment Patient Details Name: Leatha Gildingellie Pinn MRN: [id number] DOB: 04/11/1951 Today's Date: 05/18/2017    History of Present Illness Dejane Melynda RippleHobbs is a 66 y.o. female s/p INTRAMEDULLARY (IM) NAIL INTERTROCHANTRIC (Left) 05/16/17 with medical history significant of systolic CHF, COPD, fibromyalgia, chronic back pain, hyperlipidemia, hypertension, vertigo who is brought in to the emergency department after falling down 3 steps on her stairs at home, falling on her left side and injuring her left hip. She felt immediate pain and inability to bear weight on the extremity. She denies chest pain, palpitations, dizziness, diaphoresis, nausea, emesis or any other symptomatology prior to her fall. This was an accident per patient. She complains of frequent constipation, but denies abdominal pain, diarrhea, melena or hematochezia. Denies dysuria, frequency or hematuria.     PT Comments    Ms Melynda RippleHobbs is progressing in therapy however continues to be limited by severe left hip pain (8/10) when weight bearing. She currently requires moderate assist for bed mobility, tranfers and gait with front wheeled walker.  Patient will benefit from continued physical therapy in hospital and recommended venue below to address impairments and improve functional independence. Acute PT will follow   Follow Up Recommendations  SNF;Supervision/Assistance - 24 hour     Equipment Recommendations       Recommendations for Other Services       Precautions / Restrictions Precautions Precautions: Fall Restrictions Weight Bearing Restrictions: Yes LLE Weight Bearing: Weight bearing as tolerated    Mobility  Bed Mobility Overal bed mobility: Needs Assistance Bed Mobility: Supine to Sit     Supine to sit: Mod assist   Transfers Overall transfer level: Needs assistance Equipment used: Rolling walker (2 wheeled) Transfers: Sit to/from [employer] CorporationStand;Stand Pivot Transfers Sit to Stand: Mod assist Stand pivot  transfers: Min assist     Ambulation/Gait Ambulation/Gait assistance: Mod assist Ambulation Distance (Feet): 5 Feet Assistive device: Rolling walker (2 wheeled) Gait Pattern/deviations: Decreased step length - right;Decreased stance time - left;Decreased stride length;Trunk flexed;Decreased weight shift to left;Antalgic   Gait velocity interpretation: Below normal speed for age/gender General Gait Details: Patient limited to 5 steps during transfer to chair, patient unable to weight shift equally to LLE, difficulty advancing LLE due to increasing pain       Balance Overall balance assessment: Needs assistance Sitting-balance support: Bilateral upper extremity supported;Feet supported    Standing balance support: Bilateral upper extremity supported;During functional activity Standing balance-Leahy Scale: Fair     Cognition Arousal/Alertness: Awake/alert Behavior During Therapy: WFL for tasks assessed/performed Overall Cognitive Status: Within Functional Limits for tasks assessed     Exercises General Exercises - Lower Extremity Ankle Circles/Pumps: AROM;Both;10 reps;Strengthening;Seated Quad Sets: AROM;Left;5 reps;Strengthening Gluteal Sets: Seated Short Arc Quad: AROM;5 reps;Strengthening;Right;Seated (patient unable to achieve strong enough contraction on LLE)    General Comments        Pertinent Vitals/Pain Pain Score: 8  Pain Location: left hip Pain Descriptors / Indicators: Aching;Discomfort;Sore Pain Intervention(s): Limited activity within patient's tolerance;Monitored during session;Premedicated before session; provided ice at end of session for pain relief.            PT Goals (current goals can now be found in the care plan section) Acute Rehab PT Goals Patient Stated Goal: Return home after rehab PT Goal Formulation: With patient/family Time For Goal Achievement: 05/27/17 Potential to Achieve Goals: Good Progress towards PT goals: Progressing toward  goals    Frequency    7X/week      PT Plan Current plan remains appropriate  AM-PAC PT "6 Clicks" Daily Activity  Outcome Measure  Difficulty turning over in bed (including adjusting bedclothes, sheets and blankets)?: Unable Difficulty moving from lying on back to sitting on the side of the bed? : Unable Difficulty sitting down on and standing up from a chair with arms (e.g., wheelchair, bedside commode, etc,.)?: Unable Help needed moving to and from a bed to chair (including a wheelchair)?: A Lot Help needed walking in hospital room?: A Lot Help needed climbing 3-5 steps with a railing? : Total 6 Click Score: 8    End of Session Equipment Utilized During Treatment: Gait belt (front wheeled walker) Activity Tolerance: Patient limited by pain;Patient tolerated treatment well Patient left: in chair;with call bell/phone within reach;with family/visitor present Nurse Communication: Mobility status PT Visit Diagnosis: Unsteadiness on feet (R26.81);Other abnormalities of gait and mobility (R26.89);Muscle weakness (generalized) (M62.81);Difficulty in walking, not elsewhere classified (R26.2)     Time: 1340-1415 PT Time Calculation (min) (ACUTE ONLY): 35 min  Charges:  $Therapeutic Activity: 23-37 mins                    G Codes:  Functional Assessment Tool Used: AM-PAC 6 Clicks Basic Mobility Functional Limitation: Mobility: Walking and moving around    Glyn Ade, PT, DPT Physical Therapist with  [facility]  05/18/2017 4:47 PM

## 2017-05-19 DIAGNOSIS — R9431 Abnormal electrocardiogram [ECG] [EKG]: Secondary | ICD-10-CM

## 2017-05-19 DIAGNOSIS — I10 Essential (primary) hypertension: Secondary | ICD-10-CM

## 2017-05-19 DIAGNOSIS — J449 Chronic obstructive pulmonary disease, unspecified: Secondary | ICD-10-CM

## 2017-05-19 DIAGNOSIS — S72142A Displaced intertrochanteric fracture of left femur, initial encounter for closed fracture: Principal | ICD-10-CM

## 2017-05-19 DIAGNOSIS — E876 Hypokalemia: Secondary | ICD-10-CM

## 2017-05-19 LAB — BASIC METABOLIC PANEL
ANION GAP: 7 (ref 5–15)
BUN: 10 mg/dL (ref 6–20)
CALCIUM: 8 mg/dL — AB (ref 8.9–10.3)
CO2: 29 mmol/L (ref 22–32)
Chloride: 100 mmol/L — ABNORMAL LOW (ref 101–111)
Creatinine, Ser: 0.64 mg/dL (ref 0.44–1.00)
GFR calc Af Amer: >60 mL/min (ref >60)
GFR calc non Af Amer: >60 mL/min (ref >60)
GLUCOSE: 111 mg/dL — AB (ref 65–99)
Potassium: 3.5 mmol/L (ref 3.5–5.1)
Sodium: 136 mmol/L (ref 135–145)

## 2017-05-19 LAB — CBC
HCT: 22.4 % — ABNORMAL LOW (ref 36.0–46.0)
Hemoglobin: 7.5 g/dL — ABNORMAL LOW (ref 12.0–15.0)
MCH: 31.5 pg (ref 26.0–34.0)
MCHC: 33.5 g/dL (ref 30.0–36.0)
MCV: 94.1 fL (ref 78.0–100.0)
Platelets: 161 10*3/uL (ref 150–400)
RBC: 2.38 MIL/uL — ABNORMAL LOW (ref 3.87–5.11)
RDW: 14.5 % (ref 11.5–15.5)
WBC: 6.7 10*3/uL (ref 4.0–10.5)

## 2017-05-19 LAB — PREPARE RBC (CROSSMATCH)

## 2017-05-19 MED ORDER — SODIUM CHLORIDE 0.9 % IV SOLN
Freq: Once | INTRAVENOUS | Status: AC
  Administered 2017-05-19: 17:00:00 via INTRAVENOUS

## 2017-05-19 NOTE — Progress Notes (Signed)
OT Cancellation Note  Patient Details Name: Judy Snyder MRN: [id number] DOB: 08/01/1950   Cancelled Treatment:    Reason Eval/Treat Not Completed: Pain limiting ability to participate. Attempted OT evaluation this am, pt reporting 8/10 pain and requesting pain medications. Nsg alerted. Will attempt OT evaluation again at a later time.    Ezra SitesLeslie Troxler, OTR/L  [phone] 05/19/2017, 8:26 AM

## 2017-05-19 NOTE — Progress Notes (Signed)
PROGRESS NOTE    Virgina Deakins  ZOX:[id number] DOB: [birth year] DOA: 05/15/2017 PCP: Barbra Sarks, MD    Brief Narrative:  66 year old female who presented after a mechanical fall. Patient does have the significant past medical history of systolic congestive heart failure, COPD, fibromyalgia, chronic back pain, dyslipidemia and hypertension. She fell down 3 steps on her stairs at home, landing on her left side, with no head trauma or loss of consciousness. She developed severe pain, unable to stand back on her feet. On her initial physical examination blood pressure 126/65, heart rate 83, respiratory rate 16, oxygen saturation 96%. Moist mucous membranes, lungs were clear to auscultation bilaterally, no wheezing, rales or rhonchi, heart S1-S2 present rhythmic, no gallops or murmurs, the abdomen was soft nontender nondistended, left lower extremity with decreased range of motion due to pain. Odium 136, potassium 3.4, chloride 103, bicarbonate 23, glucose 95, nightly 14, creatinine 1.12, white count 12.7, hemoglobin 12.7, platelets 222, INR 0.93, urine analysis negative for infection, urine drug screen negative, alcohol level less than 11. Head CT/neck CT no acute findings, right parietal scalp hematoma. Hip films with minimally displaced intertrochanteric fracture of the left hip. Chest film hypoinflated, no infiltrates, effusions or pneumothorax. EKG, sinus rhythm with a heart rate of 85 bpm, prolonged QT corrected 617.   Patient was admitted to the hospital with the working diagnosis acute left hip fracture.   Assessment & Plan:   Principal Problem:   Closed intertrochanteric fracture of left hip (HCC) Active Problems:   Benign essential HTN   Hypokalemia   COPD (chronic obstructive pulmonary disease) (HCC)   Prolonged Q-T interval on ECG  1. Acute left hip fracture sp intramedullary nail. Will continue pain control with hydromorphone and oxycodone. Physical therapy and dvt prophylaxis.  Patient will need SNF at discharge.   2. Hypotension. Improved blood pressure with systolic 130, will continue to hold on antihypertensives.   3. Hypokalemia. It has resolved with serum K at 4,1 and preserved renal function.   4. Prolonged QT.  Follow-up EKG shows normalization of QT interval.  5. COPD. Stable with no signs of exacerbation. On Inhaled anticholinergic with tiotropium, tolerating well.   6.  Acute blood loss anemia.  Likely related to recent surgery.  She appears to be asymptomatic with tachycardia, generalized weakness and dizziness on standing.  She was transfused 2 units of PRBC today.  Follow-up CBC in a.m.  DVT prophylaxis:scd Code Status:full Family Communication:I spoke with patient's husband at the bedside and all questions were addressed Disposition Plan:Snf   Consultants:  Orthopedics  Procedures: 10/28 left hip fracture intramedullary nail, Dr. Romeo Apple  Antimicrobials:     Subjective: Continue to have pain in left hip and leg.  Difficulty with ambulation.  No obvious bleeding.  Objective: Vitals:   05/19/17 1709 05/19/17 1726 05/19/17 2000 05/19/17 2118  BP: (!) 117/44 110/61 (!) 100/43   Pulse: (!) 110 (!) 110 (!) 106   Resp: 18 18 18    Temp: 99.3 F (37.4 C) 98.9 F (37.2 C) 98.7 F (37.1 C)   TempSrc: Oral Oral Oral   SpO2: 96% 100% 95% 96%  Weight:      Height:        Intake/Output Summary (Last 24 hours) at 05/19/17 2200 Last data filed at 05/19/17 2000  Gross per 24 hour  Intake             1640 ml  Output  1000 ml  Net              640 ml   Filed Weights   05/15/17 1733 05/16/17 0015  Weight: 72.6 kg (160 lb) 75.3 kg (165 lb 14.4 oz)    Examination:   General: Not in pain or dyspnea, deconditioned Neurology: Awake and alert, non focal  E ENT: mild pallor, no icterus, oral mucosa moist Cardiovascular: No JVD. S1-S2 present, rhythmic, no gallops, rubs, or murmurs. No lower extremity  edema. Pulmonary: clear breath sounds bilaterally, adequate air movement, no wheezing, rhonchi or rales. Gastrointestinal. Abdomen flat, no organomegaly, non tender, no rebound or guarding Skin. No rashes Musculoskeletal: left knee, deformities, no erythema or increased local temperature.      Data Reviewed: I have personally reviewed following labs and imaging studies  CBC:  Recent Labs Lab 05/15/17 2021 05/16/17 0527 05/17/17 0511 05/18/17 0608 05/19/17 0436  WBC 12.7* 7.0 14.0* 6.6 6.7  NEUTROABS 10.1*  --   --   --   --   HGB 12.7 11.8* 9.4* 8.0* 7.5*  HCT 38.0 37.0 29.3* 24.2* 22.4*  MCV 94.3 95.6 98.0 94.9 94.1  PLT 222 211 205 151 161   Basic Metabolic Panel:  Recent Labs Lab 05/15/17 2021 05/16/17 0527 05/17/17 0511 05/18/17 0608 05/19/17 0436  NA 136 136 133* 132* 136  K 3.4* 4.3 4.7 4.1 3.5  CL 103 105 103 102 100*  CO2 23 24 23 27 29   GLUCOSE 95 103* 103* 110* 111*  BUN 14 15 19 14 10   CREATININE 1.12* 1.15* 1.11* 0.76 0.64  CALCIUM 8.8* 8.5* 8.1* 8.1* 8.0*  MG 2.0  --   --   --   --    GFR: Estimated Creatinine Clearance: 68.2 mL/min (by C-G formula based on SCr of 0.64 mg/dL). Liver Function Tests: No results for input(s): AST, ALT, ALKPHOS, BILITOT, PROT, ALBUMIN in the last 168 hours. No results for input(s): LIPASE, AMYLASE in the last 168 hours. No results for input(s): AMMONIA in the last 168 hours. Coagulation Profile:  Recent Labs Lab 05/15/17 2021  INR 0.93   Cardiac Enzymes: No results for input(s): CKTOTAL, CKMB, CKMBINDEX, TROPONINI in the last 168 hours. BNP (last 3 results) No results for input(s): PROBNP in the last 8760 hours. HbA1C: No results for input(s): HGBA1C in the last 72 hours. CBG: No results for input(s): GLUCAP in the last 168 hours. Lipid Profile: No results for input(s): CHOL, HDL, LDLCALC, TRIG, CHOLHDL, LDLDIRECT in the last 72 hours. Thyroid Function Tests: No results for input(s): TSH, T4TOTAL, FREET4,  T3FREE, THYROIDAB in the last 72 hours. Anemia Panel: No results for input(s): VITAMINB12, FOLATE, FERRITIN, TIBC, IRON, RETICCTPCT in the last 72 hours.    Radiology Studies: I have reviewed all of the imaging during this hospital visit personally     Scheduled Meds: . aspirin EC  325 mg Oral Q breakfast  . feeding supplement (ENSURE ENLIVE)  237 mL Oral BID BM  . Milnacipran  100 mg Oral BID  . pantoprazole  40 mg Oral Daily  . senna  1 tablet Oral BID  . tiotropium  18 mcg Inhalation Daily   Continuous Infusions:   LOS: 4 days        Gael Delude, MD [facility] Pager [phone]

## 2017-05-19 NOTE — Progress Notes (Signed)
Patient ID: Judy Snyder, female   DOB: 07/07/1951, 66 y.o.   MRN: [id number] BP (!) 105/53 (BP Location: Left Arm)   Pulse (!) 107   Temp 98.7 F (37.1 C) (Oral)   Resp 20   Ht 5\' 3"  (1.6 m)   Wt 165 lb 14.4 oz (75.3 kg)   SpO2 97%   BMI 29.39 kg/m   CBC Latest Ref Rng & Units 05/19/2017 05/18/2017 05/17/2017  WBC 4.0 - 10.5 K/uL 6.7 6.6 14.0(H)  Hemoglobin 12.0 - 15.0 g/dL 7.5(L) 8.0(L) 9.4(L)  Hematocrit 36.0 - 46.0 % 22.4(L) 24.2(L) 29.3(L)  Platelets 150 - 400 K/uL 161 151 205   BMP Latest Ref Rng & Units 05/19/2017 05/18/2017 05/17/2017  Glucose 65 - 99 mg/dL 409(W111(H) 119(J110(H) 478(G103(H)  BUN 6 - 20 mg/dL 10 14 19   Creatinine 0.44 - 1.00 mg/dL 9.560.64 2.130.76 0.86(V1.11(H)  Sodium 135 - 145 mmol/L 136 132(L) 133(L)  Potassium 3.5 - 5.1 mmol/L 3.5 4.1 4.7  Chloride 101 - 111 mmol/L 100(L) 102 103  CO2 22 - 32 mmol/L 29 27 23   Calcium 8.9 - 10.3 mg/dL 8.0(L) 8.1(L) 8.1(L)   Less bloody drainage today although hg is lower   PICC LINE PLACED AND XRAYS SHOW GOOD PLACEMENT  REC [facility] FOR REHAB  CHECK HG TOMORROW

## 2017-05-19 NOTE — Plan of Care (Signed)
Problem: Safety: Goal: Ability to remain free from injury will improve Outcome: Progressing Patient ambulates in room with PT and nursing assist, up to chair today. Tolerates fairly well with some c/o discomfort during activity. Pt aware of fall prevention/safety plan. Instructed to call for assistance and not attempt getting up on her own. Call light within reach. Personal items at bedside.

## 2017-05-19 NOTE — Progress Notes (Signed)
Physical Therapy Treatment Patient Details Name: Judy Snyder MRN: [id number] DOB: 12/19/1950 Today's Date: 05/19/2017    History of Present Illness Judy Snyder is a 66 y.o. female s/p INTRAMEDULLARY (IM) NAIL INTERTROCHANTRIC (Left) 05/16/17 with medical history significant of systolic CHF, COPD, fibromyalgia, chronic back pain, hyperlipidemia, hypertension, vertigo who is brought in to the emergency department after falling down 3 steps on her stairs at home, falling on her left side and injuring her left hip. She felt immediate pain and inability to bear weight on the extremity. She denies chest pain, palpitations, dizziness, diaphoresis, nausea, emesis or any other symptomatology prior to her fall. This was an accident per patient. She complains of frequent constipation, but denies abdominal pain, diarrhea, melena or hematochezia. Denies dysuria, frequency or hematuria.     PT Comments    Patient demonstrates increased tolerance/LLE strength for taking steps, has difficulty moving LLE when sitting up at bedside and tolerated sitting up in chair after therapy - later put back to bed by nursing staff.  Patient will benefit from continued physical therapy in hospital and recommended venue below to increase strength, balance, endurance for safe ADLs and gait.   Follow Up Recommendations  SNF;Supervision/Assistance - 24 hour     Equipment Recommendations       Recommendations for Other Services       Precautions / Restrictions Precautions Precautions: Fall Restrictions Weight Bearing Restrictions: Yes LLE Weight Bearing: Weight bearing as tolerated    Mobility  Bed Mobility Overal bed mobility: Needs Assistance Bed Mobility: Supine to Sit     Supine to sit: Mod assist        Transfers Overall transfer level: Needs assistance Equipment used: Rolling walker (2 wheeled) Transfers: Sit to/from [employer]Stand;Stand Pivot Transfers Sit to Stand: Min assist;Mod assist Stand pivot  transfers: Min assist          Ambulation/Gait Ambulation/Gait assistance: Mod assist Ambulation Distance (Feet): 10 Feet Assistive device: Rolling walker (2 wheeled) Gait Pattern/deviations: Decreased step length - right;Decreased step length - left;Decreased stance time - left;Decreased stride length   Gait velocity interpretation: Below normal speed for age/gender General Gait Details: Patient demonstrates increased endurance and LLE strength for taking steps with slow labored cadence, limited stance on LLE due to hip pain   Stairs            Wheelchair Mobility    Modified Rankin (Stroke Patients Only)       Balance Overall balance assessment: Needs assistance Sitting-balance support: Bilateral upper extremity supported;Feet supported Sitting balance-Leahy Scale: Good     Standing balance support: Bilateral upper extremity supported;During functional activity Standing balance-Leahy Scale: Fair                              Cognition Arousal/Alertness: Awake/alert Behavior During Therapy: WFL for tasks assessed/performed Overall Cognitive Status: Within Functional Limits for tasks assessed                                        Exercises Total Joint Exercises Ankle Circles/Pumps: AROM;Strengthening;Both;Supine;10 reps Quad Sets: Supine;Both;Strengthening;AROM;10 reps Gluteal Sets: Supine;Both;Strengthening;AROM;10 reps Short Arc Quad: Strengthening;Left;10 reps;Supine;AROM Heel Slides: AAROM;Strengthening;Left;Supine    General Comments        Pertinent Vitals/Pain Pain Assessment: 0-10 Pain Score: 8  Pain Location: left hip Pain Descriptors / Indicators: Sharp;Aching Pain Intervention(s): Limited activity within patient's  tolerance;Monitored during session;Premedicated before session    Home Living                      Prior Function            PT Goals (current goals can now be found in the care plan  section) Acute Rehab PT Goals Patient Stated Goal: Return home after rehab PT Goal Formulation: With patient/family Time For Goal Achievement: 05/27/17 Potential to Achieve Goals: Good Progress towards PT goals: Progressing toward goals    Frequency    7X/week      PT Plan Current plan remains appropriate    Co-evaluation              AM-PAC PT "6 Clicks" Daily Activity  Outcome Measure  Difficulty turning over in bed (including adjusting bedclothes, sheets and blankets)?: A Lot Difficulty moving from lying on back to sitting on the side of the bed? : A Lot Difficulty sitting down on and standing up from a chair with arms (e.g., wheelchair, bedside commode, etc,.)?: A Lot Help needed moving to and from a bed to chair (including a wheelchair)?: A Lot Help needed walking in hospital room?: A Lot Help needed climbing 3-5 steps with a railing? : Total 6 Click Score: 11    End of Session   Activity Tolerance: Patient tolerated treatment well;Patient limited by fatigue;Patient limited by pain Patient left: in chair;with call bell/phone within reach;with family/visitor present Nurse Communication: Mobility status PT Visit Diagnosis: Unsteadiness on feet (R26.81);Other abnormalities of gait and mobility (R26.89);Muscle weakness (generalized) (M62.81);Difficulty in walking, not elsewhere classified (R26.2)     Time: 1610-9604 PT Time Calculation (min) (ACUTE ONLY): 31 min  Charges:  $Therapeutic Exercise: 8-22 mins $Therapeutic Activity: 8-22 mins                    G Codes:       11:55 AM, 05/27/17 Ocie Bob, MPT Physical Therapist with [facility] [phone] office [phone] mobile phone

## 2017-05-20 ENCOUNTER — Inpatient Hospital Stay (HOSPITAL_COMMUNITY): Payer: Medicare HMO

## 2017-05-20 DIAGNOSIS — Z4789 Encounter for other orthopedic aftercare: Secondary | ICD-10-CM | POA: Diagnosis not present

## 2017-05-20 DIAGNOSIS — M6281 Muscle weakness (generalized): Secondary | ICD-10-CM | POA: Diagnosis not present

## 2017-05-20 DIAGNOSIS — R278 Other lack of coordination: Secondary | ICD-10-CM | POA: Diagnosis not present

## 2017-05-20 DIAGNOSIS — Y92018 Other place in single-family (private) house as the place of occurrence of the external cause: Secondary | ICD-10-CM | POA: Diagnosis not present

## 2017-05-20 DIAGNOSIS — X58XXXD Exposure to other specified factors, subsequent encounter: Secondary | ICD-10-CM | POA: Diagnosis not present

## 2017-05-20 DIAGNOSIS — I959 Hypotension, unspecified: Secondary | ICD-10-CM | POA: Diagnosis not present

## 2017-05-20 DIAGNOSIS — W108XXA Fall (on) (from) other stairs and steps, initial encounter: Secondary | ICD-10-CM | POA: Diagnosis not present

## 2017-05-20 DIAGNOSIS — M25552 Pain in left hip: Secondary | ICD-10-CM | POA: Diagnosis not present

## 2017-05-20 DIAGNOSIS — R2689 Other abnormalities of gait and mobility: Secondary | ICD-10-CM | POA: Diagnosis not present

## 2017-05-20 DIAGNOSIS — S72142A Displaced intertrochanteric fracture of left femur, initial encounter for closed fracture: Secondary | ICD-10-CM | POA: Diagnosis not present

## 2017-05-20 DIAGNOSIS — R9431 Abnormal electrocardiogram [ECG] [EKG]: Secondary | ICD-10-CM | POA: Diagnosis not present

## 2017-05-20 DIAGNOSIS — M25562 Pain in left knee: Secondary | ICD-10-CM | POA: Diagnosis not present

## 2017-05-20 DIAGNOSIS — J449 Chronic obstructive pulmonary disease, unspecified: Secondary | ICD-10-CM | POA: Diagnosis not present

## 2017-05-20 DIAGNOSIS — Z96652 Presence of left artificial knee joint: Secondary | ICD-10-CM | POA: Diagnosis not present

## 2017-05-20 DIAGNOSIS — J4542 Moderate persistent asthma with status asthmaticus: Secondary | ICD-10-CM | POA: Diagnosis not present

## 2017-05-20 DIAGNOSIS — Z23 Encounter for immunization: Secondary | ICD-10-CM | POA: Diagnosis not present

## 2017-05-20 DIAGNOSIS — Z471 Aftercare following joint replacement surgery: Secondary | ICD-10-CM | POA: Diagnosis not present

## 2017-05-20 DIAGNOSIS — Y9301 Activity, walking, marching and hiking: Secondary | ICD-10-CM | POA: Diagnosis not present

## 2017-05-20 DIAGNOSIS — S728X2A Other fracture of left femur, initial encounter for closed fracture: Secondary | ICD-10-CM | POA: Diagnosis not present

## 2017-05-20 DIAGNOSIS — M1612 Unilateral primary osteoarthritis, left hip: Secondary | ICD-10-CM | POA: Diagnosis not present

## 2017-05-20 DIAGNOSIS — E876 Hypokalemia: Secondary | ICD-10-CM | POA: Diagnosis not present

## 2017-05-20 DIAGNOSIS — D62 Acute posthemorrhagic anemia: Secondary | ICD-10-CM | POA: Diagnosis not present

## 2017-05-20 DIAGNOSIS — I10 Essential (primary) hypertension: Secondary | ICD-10-CM | POA: Diagnosis not present

## 2017-05-20 DIAGNOSIS — S72002D Fracture of unspecified part of neck of left femur, subsequent encounter for closed fracture with routine healing: Secondary | ICD-10-CM | POA: Diagnosis not present

## 2017-05-20 LAB — TYPE AND SCREEN
ABO/RH(D): O POS
Antibody Screen: NEGATIVE
UNIT DIVISION: 0
Unit division: 0

## 2017-05-20 LAB — BASIC METABOLIC PANEL
Anion gap: 9 (ref 5–15)
BUN: 11 mg/dL (ref 6–20)
CALCIUM: 8.7 mg/dL — AB (ref 8.9–10.3)
CO2: 30 mmol/L (ref 22–32)
CREATININE: 0.65 mg/dL (ref 0.44–1.00)
Chloride: 97 mmol/L — ABNORMAL LOW (ref 101–111)
GFR calc Af Amer: >60 mL/min (ref >60)
Glucose, Bld: 107 mg/dL — ABNORMAL HIGH (ref 65–99)
POTASSIUM: 3.9 mmol/L (ref 3.5–5.1)
SODIUM: 136 mmol/L (ref 135–145)

## 2017-05-20 LAB — BPAM RBC
BLOOD PRODUCT EXPIRATION DATE: 201811292359
Blood Product Expiration Date: 201811292359
ISSUE DATE / TIME: 201810311259
ISSUE DATE / TIME: 201810311655
UNIT TYPE AND RH: 5100
Unit Type and Rh: 5100

## 2017-05-20 LAB — CBC
HCT: 33.2 % — ABNORMAL LOW (ref 36.0–46.0)
Hemoglobin: 11 g/dL — ABNORMAL LOW (ref 12.0–15.0)
MCH: 31.2 pg (ref 26.0–34.0)
MCHC: 33.1 g/dL (ref 30.0–36.0)
MCV: 94.1 fL (ref 78.0–100.0)
PLATELETS: 148 10*3/uL — AB (ref 150–400)
RBC: 3.53 MIL/uL — AB (ref 3.87–5.11)
RDW: 14.9 % (ref 11.5–15.5)
WBC: 7.1 10*3/uL (ref 4.0–10.5)

## 2017-05-20 MED ORDER — SENNA 8.6 MG PO TABS: 1.0000 | ORAL_TABLET | Freq: Two times a day (BID) | ORAL | 0 refills | Status: AC

## 2017-05-20 MED ORDER — METOPROLOL TARTRATE 25 MG PO TABS: 12.5000 mg | ORAL_TABLET | Freq: Two times a day (BID) | ORAL | Status: AC

## 2017-05-20 MED ORDER — OXYCODONE HCL 5 MG PO TABS: 5.0000 mg | ORAL_TABLET | ORAL | 0 refills | Status: DC | PRN

## 2017-05-20 MED ORDER — ASPIRIN EC 325 MG PO TBEC: 325.0000 mg | DELAYED_RELEASE_TABLET | Freq: Every day | ORAL | Status: AC

## 2017-05-20 MED ORDER — METHOCARBAMOL 500 MG PO TABS: 500.0000 mg | ORAL_TABLET | Freq: Three times a day (TID) | ORAL | Status: AC

## 2017-05-20 MED ORDER — METHOCARBAMOL 500 MG PO TABS
500.0000 mg | ORAL_TABLET | Freq: Three times a day (TID) | ORAL | Status: DC
  Administered 2017-05-20: 500 mg via ORAL
  Filled 2017-05-20: qty 1

## 2017-05-20 NOTE — Plan of Care (Signed)
Problem: Education: Goal: Knowledge of [facility] Education information/materials will improve Outcome: Progressing Discussed and reviewed plan of care with patient/ family member-verbalized understanding.

## 2017-05-20 NOTE — Care Management Note (Signed)
Case Management Note  Patient Details  Name: Leatha Gildingellie Wagman MRN: [id number] Date of Birth: 02/18/1951  If discussed at Long Length of Stay Meetings, dates discussed:  05/20/2017  Additional Comments: DC to SNF today. CSW has made arrangements.   Malcolm Metrohildress, Lola Czerwonka Demske, RN 05/20/2017, 3:11 PM

## 2017-05-20 NOTE — Progress Notes (Signed)
Patient reports "sharp, sudden pain that felt like a pop" to left thigh area during ambulation with PT. States pain occurred on way back to bed, reports pain is 9/10 on 0-10 pain scale at this time. Dressing saturated with moderate amount of serosanguinous drainage on assessment. Cleansed with normal saline and new dressing applied. Clean, dry and intact. Paged Dr. Romeo AppleHarrison to notify of report of pain and amount of drainage. Awaiting return call. Earnstine RegalAshley Mirage Pfefferkorn, RN

## 2017-05-20 NOTE — Clinical Social Work Note (Signed)
LCSW received [facility] [facility] had [facility].     Amie Cowens, Juleen ChinaHeather D, LCSW

## 2017-05-20 NOTE — Care Management Important Message (Signed)
Important Message  Patient Details  Name: Judy Snyder MRN: [id number] Date of Birth: 07/28/1950   Medicare Important Message Given:  Yes    Malcolm MetroChildress, Verena Shawgo Demske, RN 05/20/2017, 3:12 PM

## 2017-05-20 NOTE — Evaluation (Signed)
Occupational Therapy Evaluation Patient Details Name: Judy Snyder MRN: [id number] DOB: 08/31/1950 Today's Date: 05/20/2017    History of Present Illness Judy Snyder is a 66 y.o. female s/p INTRAMEDULLARY (IM) NAIL INTERTROCHANTRIC (Left) 05/16/17 with medical history significant of systolic CHF, COPD, fibromyalgia, chronic back pain, hyperlipidemia, hypertension, vertigo who is brought in to the emergency department after falling down 3 steps on her stairs at home, falling on her left side and injuring her left hip. She felt immediate pain and inability to bear weight on the extremity. She denies chest pain, palpitations, dizziness, diaphoresis, nausea, emesis or any other symptomatology prior to her fall. This was an accident per patient. She complains of frequent constipation, but denies abdominal pain, diarrhea, melena or hematochezia. Denies dysuria, frequency or hematuria.    Clinical Impression   Pt received semi-reclined in bed, husband present, agreeable to OT evaluation. Pt requiring increased assistance for ADL completion and functional mobility, currently at set-up/supervision level with seated UB tasks, max assist for LB tasks. Pt is unable to perform tasks in standing at this time due to pain. Recommend SNF on discharge to improve safety and independence with ADL completion and functional mobility tasks.     Follow Up Recommendations  SNF;Supervision/Assistance - 24 hour    Equipment Recommendations  None recommended by OT       Precautions / Restrictions Precautions Precautions: Fall Restrictions Weight Bearing Restrictions: Yes LLE Weight Bearing: Weight bearing as tolerated      Mobility Bed Mobility Overal bed mobility: Needs Assistance Bed Mobility: Supine to Sit     Supine to sit: Mod assist;HOB elevated        Transfers Overall transfer level: Needs assistance Equipment used: Rolling walker (2 wheeled) Transfers: Sit to/from [employer] CorporationStand;Stand Pivot  Transfers Sit to Stand: Mod assist Stand pivot transfers: Min assist                ADL either performed or assessed with clinical judgement   ADL Overall ADL's : Needs assistance/impaired Eating/Feeding: Modified independent;Sitting   Grooming: Set up;Sitting               Lower Body Dressing: Maximal assistance;Sitting/lateral leans   Toilet Transfer: Minimal assistance;Stand-pivot;RW   Toileting- Clothing Manipulation and Hygiene: Min guard;Sitting/lateral lean               Vision Baseline Vision/History: No visual deficits Patient Visual Report: No change from baseline Vision Assessment?: No apparent visual deficits            Pertinent Vitals/Pain Pain Assessment: 0-10 Pain Score: 7  Pain Location: left hip Pain Descriptors / Indicators: Sharp;Aching Pain Intervention(s): Limited activity within patient's tolerance;Monitored during session;Premedicated before session;Repositioned     Hand Dominance Right   Extremity/Trunk Assessment Upper Extremity Assessment Upper Extremity Assessment: Overall WFL for tasks assessed   Lower Extremity Assessment Lower Extremity Assessment: Defer to PT evaluation   Cervical / Trunk Assessment Cervical / Trunk Assessment: Normal   Communication Communication Communication: No difficulties   Cognition Arousal/Alertness: Awake/alert Behavior During Therapy: WFL for tasks assessed/performed Overall Cognitive Status: Within Functional Limits for tasks assessed                                                Home Living   Living Arrangements: Spouse/significant other Available Help at Discharge: Family;Available 24 hours/day Type of Home: House  Home Access: Stairs to enter [employer] of Steps: 3 Entrance Stairs-Rails: Right;Left;Can reach both Home Layout: Multi-level Alternate Level Stairs-Number of Steps: 12 steps to basement, they stay in basement Alternate Level  Stairs-Rails: Right           Home Equipment: Walker - 2 wheels;Cane - single point;Shower seat - built in          Prior Functioning/Environment Level of Independence: Independent with assistive device(s)        Comments: Patient was ambulating with SPC prior to surgery        OT Problem List: Decreased activity tolerance;Impaired balance (sitting and/or standing);Pain       End of Session Equipment Utilized During Treatment: Gait belt;Rolling walker  Activity Tolerance: Patient tolerated treatment well Patient left: in chair;with call bell/phone within reach;with family/visitor present  OT Visit Diagnosis: Muscle weakness (generalized) (M62.81);Pain Pain - Right/Left: Left Pain - part of body: Hip                Time: 0801-0829 OT Time Calculation (min): 28 min Charges:  OT General Charges $OT Visit: 1 Visit OT Evaluation $OT Eval Low Complexity: 1 Low    Ezra Sites, OTR/L  [phone] 05/20/2017, 8:33 AM

## 2017-05-20 NOTE — Progress Notes (Signed)
Patient reports still having sharp pain to left thigh area, rated 8/10 on 0-10 pain scale after receiving pain medication as ordered. Received call back from Dr. Romeo AppleHarrison. Notified him of continual c/o pain, amount of drainage to dressing, and neurovascular assessment within normal limits. Stated okay, no new orders received at this time. Earnstine RegalAshley Enzo Treu, RN

## 2017-05-20 NOTE — Plan of Care (Signed)
Problem: Pain Managment: Goal: General experience of comfort will improve Outcome: Progressing Assessed and monitoring pain control status; pain level 8/10-medicated for relief incisional pain-monitoring accordingly.

## 2017-05-20 NOTE — Progress Notes (Signed)
Patient ID: Judy Snyder, female   DOB: 01/03/1951, 66 y.o.   MRN: [id number] BP (!) 125/40 (BP Location: Left Arm)   Pulse (!) 101   Temp 98.7 F (37.1 C) (Oral)   Resp 15   Ht 5\' 3"  (1.6 m)   Wt 165 lb 14.4 oz (75.3 kg)   SpO2 99%   BMI 29.39 kg/m   CBC Latest Ref Rng & Units 05/20/2017 05/19/2017 05/18/2017  WBC 4.0 - 10.5 K/uL 7.1 6.7 6.6  Hemoglobin 12.0 - 15.0 g/dL 11.0(L) 7.5(L) 8.0(L)  Hematocrit 36.0 - 46.0 % 33.2(L) 22.4(L) 24.2(L)  Platelets 150 - 400 K/uL 148(L) 161 151   BMP Latest Ref Rng & Units 05/20/2017 05/19/2017 05/18/2017  Glucose 65 - 99 mg/dL 784(O107(H) 962(X111(H) 528(U110(H)  BUN 6 - 20 mg/dL 11 10 14   Creatinine 0.44 - 1.00 mg/dL 1.320.65 4.400.64 1.020.76  Sodium 135 - 145 mmol/L 136 136 132(L)  Potassium 3.5 - 5.1 mmol/L 3.9 3.5 4.1  Chloride 101 - 111 mmol/L 97(L) 100(L) 102  CO2 22 - 32 mmol/L 30 29 27   Calcium 8.9 - 10.3 mg/dL 7.2(Z8.7(L) 3.6(U8.0(L) 8.1(L)    Felt a pop while in PT   xrays hip to knee   Prior non displaced lesser troch fracture now displaced   IT WILL NOT CHANGE THE TREATMENT PLAN   OK TO DC AND SAME FOLLOW UP

## 2017-05-20 NOTE — Discharge Summary (Signed)
Physician Discharge Summary  Chaney Ingram AVW:[id number] DOB: [birth year] DOA: 05/15/2017  PCP: Barbra Sarks, MD  Admit date: 05/15/2017 Discharge date: 05/20/2017  Admitted From: Home Disposition: Skilled nursing facility  Recommendations for Outpatient Follow-up:  1. Follow up with PCP in 1-2 weeks 2. Please obtain BMP/CBC in one week 3. Weightbearing as tolerated 4. Follow-up with Dr. Romeo Apple in 2 weeks 5. Staples can be removed on 11/11  Home Health: Equipment/Devices:  Discharge Condition: Stable CODE STATUS: Full code Diet recommendation: Heart Healthy   Brief/Interim Summary: 66 year old female who presented after a mechanical fall. Patient does have the significant past medical history of systolic congestive heart failure, COPD, fibromyalgia, chronic back pain, dyslipidemia and hypertension. She fell down 3 steps on her stairs at home, landing on her left side, with no head trauma or loss of consciousness. She developed severe pain, unable to stand back on her feet. On her initial physical examination blood pressure 126/65, heart rate 83, respiratory rate 16, oxygen saturation 96%. Moist mucous membranes, lungs were clear to auscultation bilaterally, no wheezing, rales or rhonchi, heart S1-S2 present rhythmic, no gallops or murmurs, the abdomen was soft nontender nondistended, left lower extremity with decreased range of motion due to pain. Odium 136, potassium 3.4, chloride 103, bicarbonate 23, glucose 95, nightly 14, creatinine 1.12, white count 12.7, hemoglobin 12.7, platelets 222, INR 0.93, urine analysis negative for infection, urine drug screen negative, alcohol level less than 11. Head CT/neck CT no acute findings, right parietal scalp hematoma. Hip films with minimally displaced intertrochanteric fracture of the left hip. Chest film hypoinflated, no infiltrates, effusions or pneumothorax. EKG, sinus rhythm with a heart rate of 85 bpm, prolonged QT corrected 617.   Patient  was admitted to the hospital with the working diagnosis acute left hip fracture.  Discharge Diagnoses:  Principal Problem:   Closed intertrochanteric fracture of left hip (HCC) Active Problems:   Benign essential HTN   Hypokalemia   COPD (chronic obstructive pulmonary disease) (HCC)   Prolonged Q-T interval on ECG  1. Acute left hip fracture sp intramedullary nail.  Continue with physical therapy and pain management.  She is weightbearing as tolerated.  Follow-up with Dr. Romeo Apple in 2 weeks.  She did feel a pop while working with physical therapy today.  X-rays of her hip and knee were repeated.  This showed prior nondisplaced lesser trochanter fracture now appears to be displaced.  Dr. Romeo Apple evaluated the patient did not feel that any changes need to be made to her treatment plan.  She has been cleared for discharge.  2. Hypotension.  Hydrochlorothiazide and verapamil held on discharge due to borderline blood pressures.  Metoprolol has been resumed at reduced dose.  This can be titrated up to 50 mg twice daily as patient tolerates..   3. Hypokalemia. It has resolved with serum K at 4,1 and preserved renal function.   4. Prolonged QT.  Follow-up EKG shows normalization of QT interval.  5. COPD. Stable with no signs of exacerbation. On Inhaled anticholinergic withtiotropium, tolerating well.   6.  Acute blood loss anemia.  Likely related to recent surgery.    Patient was symptomatic with tachycardia, generalized weakness and dizziness on standing.  She was transfused 2 units of PRBC with improvement of hemoglobin.  Discharge Instructions  Discharge Instructions    Diet - low sodium heart healthy    Complete by:  As directed    Increase activity slowly    Complete by:  As directed  Allergies as of 05/20/2017   No Known Allergies     Medication List    STOP taking these medications   hydrochlorothiazide 25 MG tablet Commonly known as:  HYDRODIURIL   verapamil 240  MG CR tablet Commonly known as:  CALAN-SR     TAKE these medications   acetaminophen 650 MG CR tablet Commonly known as:  TYLENOL Take 650 mg by mouth every 8 (eight) hours as needed for pain.   albuterol 108 (90 Base) MCG/ACT inhaler Commonly known as:  PROVENTIL HFA;VENTOLIN HFA Inhale 2 puffs into the lungs every 4 (four) hours as needed for wheezing or shortness of breath.   alendronate 70 MG tablet Commonly known as:  FOSAMAX Take 1 tablet by mouth once a week.   aspirin EC 325 MG tablet Take 1 tablet (325 mg total) by mouth daily. What changed:  medication strength  how much to take   BREO ELLIPTA 200-25 MCG/INH Aepb Generic drug:  fluticasone furoate-vilanterol Inhale 1 puff into the lungs daily.   FISH OIL PO Take 1 capsule by mouth daily.   lubiprostone 24 MCG capsule Commonly known as:  AMITIZA Take 24 mcg by mouth daily as needed for constipation.   methocarbamol 500 MG tablet Commonly known as:  ROBAXIN Take 1 tablet (500 mg total) by mouth 3 (three) times daily.   metoprolol tartrate 25 MG tablet Commonly known as:  LOPRESSOR Take 0.5 tablets (12.5 mg total) by mouth 2 (two) times daily. What changed:  medication strength  how much to take   MYRBETRIQ 25 MG Tb24 tablet Generic drug:  mirabegron ER Take 25 mg by mouth daily.   nitroGLYCERIN 0.4 MG SL tablet Commonly known as:  NITROSTAT Place 0.4 mg under the tongue every 5 (five) minutes as needed for chest pain.   omeprazole 40 MG capsule Commonly known as:  PRILOSEC Take 40 mg by mouth daily.   oxyCODONE 5 MG immediate release tablet Commonly known as:  Oxy IR/ROXICODONE Take 1-2 tablets (5-10 mg total) by mouth every 4 (four) hours as needed for breakthrough pain ((for MODERATE breakthrough pain)).   RANEXA 1000 MG SR tablet Generic drug:  ranolazine Take 1,000 mg by mouth daily.   SAVELLA 100 MG Tabs tablet Generic drug:  Milnacipran HCl Take 100 mg by mouth 2 (two) times  daily.   senna 8.6 MG Tabs tablet Commonly known as:  SENOKOT Take 1 tablet (8.6 mg total) by mouth 2 (two) times daily.   thiamine 100 MG tablet Commonly known as:  VITAMIN B-1 Take 100 mg by mouth daily.   tiotropium 18 MCG inhalation capsule Commonly known as:  SPIRIVA Place 18 mcg into inhaler and inhale daily.   VITAMIN D3 PO Take 1 capsule by mouth daily.       No Known Allergies  Consultations:  Orthopedics, Dr. Romeo Apple   Procedures/Studies: Dg Pelvis 1-2 Views  Result Date: 05/16/2017 CLINICAL DATA:  Left hip fracture.  Postop from internal fixation. EXAM: PELVIS - 1-2 VIEW COMPARISON:  05/15/2017 FINDINGS: Interlocking compression screw and intramedullary rod fixation of intertrochanteric left hip fracture seen which is in near anatomic alignment. No other osseous abnormality identified. IMPRESSION: Internal fixation of intertrochanteric left hip fracture in near anatomic alignment. Electronically Signed   By: Myles Rosenthal M.D.   On: 05/16/2017 15:48   Dg Pelvis 1-2 Views  Result Date: 05/15/2017 CLINICAL DATA:  Fall down 3 steps with left hip pain. EXAM: PELVIS - 1-2 VIEW COMPARISON:  05/12/2014 FINDINGS: Exam  demonstrates diffuse osteopenia. There is mild symmetric degenerative change of the hips. There is a minimally displaced intertrochanteric fracture of the left hip. There are degenerative changes of the spine. Hardware unchanged over the midline L5-S1 level. IMPRESSION: Minimally displaced intertrochanteric fracture of the left hip. Electronically Signed   By: Elberta Fortis M.D.   On: 05/15/2017 20:02   Dg Knee 1-2 Views Left  Result Date: 05/20/2017 CLINICAL DATA:  Pain EXAM: LEFT KNEE - 1-2 VIEW COMPARISON:  Left tibia and fibula May 15, 2017 FINDINGS: Frontal and lateral views were obtained. There is an old fracture of the proximal tibial diaphysis, healed. 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 No acute fracture or dislocation. No joint effusion. The postoperative change involves the patella along the posterior aspect without erosion. There are multiple phleboliths in the region of the proximal tibia medially. IMPRESSION: Postoperative changes with prosthetic components well-seated. Old healed fracture proximal tibial diaphysis. No acute fracture or dislocation. No joint effusion. Electronically Signed   By: Bretta Bang III M.D.   On: 05/20/2017 12:46   Dg Tibia/fibula Left  Result Date: 05/15/2017 CLINICAL DATA:  Fall down 3 steps with left lower leg pain. EXAM: LEFT TIBIA AND FIBULA - 2 VIEW COMPARISON:  None. FINDINGS: Left knee arthroplasty intact. Focal cortical buckling over the proximal third of the tibial diaphysis which may be due to acute versus chronic injury. IMPRESSION: Cortical buckling over the proximal third of the tibial diaphysis which may be due to acute versus chronic injury. Left knee arthroplasty intact. Electronically Signed   By: Elberta Fortis M.D.   On: 05/15/2017 20:00   Dg Ankle Complete Left  Result Date: 05/15/2017 CLINICAL DATA:  Fall down 3 steps with left ankle pain. EXAM: LEFT ANKLE COMPLETE - 3+ VIEW COMPARISON:  None. FINDINGS: Diffuse osteopenia. No acute fracture. Ankle mortise is within normal. Possible abnormal rotation of the talus on the lateral film. IMPRESSION: No acute fracture identified. Possible abnormal rotation of the talus on the lateral film. Consider left foot series for further evaluation. Electronically Signed   By: Elberta Fortis M.D.   On: 05/15/2017 20:05   Ct Tibia Fibula Left Wo Contrast  Result Date: 05/15/2017 CLINICAL DATA:  Assess for fracture along the mid tibia. Initial encounter. EXAM: CT OF THE LOWER LEFT EXTREMITY WITHOUT CONTRAST TECHNIQUE: Multidetector CT imaging of the lower left extremity was performed according to the standard protocol. COMPARISON:  None. FINDINGS: Bones/Joint/Cartilage There is no  evidence of acute fracture or dislocation. The mild deformity of the proximal tibial diaphysis reflects remote healed injury. The patient's total knee arthroplasty is grossly unremarkable in appearance, without evidence of loosening. The fibula appears intact. There is chronic anterior narrowing of the ankle mortise, with scattered subcortical cystic change and sclerosis. No significant knee joint effusion is seen. Ligaments Suboptimally assessed by CT. Muscles and Tendons There is mild atrophy of the musculature of the distal thigh. No acute abnormalities are seen. The visualized tendon structures are grossly unremarkable. Soft tissues No soft tissue hematoma is seen. Scattered soft tissue calcifications at the proximal lower leg are likely postoperative in nature. IMPRESSION: 1. No evidence of acute fracture or dislocation. 2. Total knee arthroplasty is unremarkable in appearance, without evidence of loosening. 3. Chronic anterior narrowing of the ankle mortise, with scattered subcortical cystic change and sclerosis. Electronically Signed   By: Roanna Raider M.D.   On: 05/15/2017 22:39   Dg Chest Port 1  View  Result Date: 05/18/2017 CLINICAL DATA:  Line placement.  CHF.  COPD.  Cough. EXAM: PORTABLE CHEST 1 VIEW COMPARISON:  05/16/2017 FINDINGS: Bilateral shoulder arthroplasties. Right-sided PICC line terminates at the low SVC. Midline trachea. Normal heart size. Atherosclerosis in the transverse aorta. Mild right hemidiaphragm elevation. No pleural effusion or pneumothorax. Low lung volumes with resultant pulmonary interstitial prominence. superimposed diffuse interstitial thickening. No lobar consolidation. IMPRESSION: No acute cardiopulmonary disease. Right-sided PICC line terminating at low SVC. Aortic Atherosclerosis (ICD10-I70.0). Low lung volumes. Peribronchial thickening which may relate to chronic bronchitis or smoking. Electronically Signed   By: Jeronimo Greaves M.D.   On: 05/18/2017 20:53   Chest  Portable 1 View  Result Date: 05/16/2017 CLINICAL DATA:  Fall Left hip fracture EXAM: PORTABLE CHEST 1 VIEW COMPARISON:  09/21/2015 FINDINGS: Shallow lung inflation. Heart size is accentuated by technique and inspiratory effort. No focal consolidations or pleural effusions. No pulmonary edema. Status post bilateral shoulder arthroplasty and cervical fusion. IMPRESSION: Shallow inflation.  No evidence for acute  abnormality. Electronically Signed   By: Norva Pavlov M.D.   On: 05/16/2017 08:17   Dg Hip Operative Unilat With Pelvis Left  Result Date: 05/16/2017 CLINICAL DATA:  Intertrochanteric left hip fracture. EXAM: OPERATIVE LEFT HIP (WITH PELVIS IF PERFORMED) 10 VIEWS TECHNIQUE: Fluoroscopic spot image(s) were submitted for interpretation post-operatively. COMPARISON:  None. FINDINGS: Multiple fluoroscopic spot images show placement of interlocking screw and intramedullary rod, which is seen transfixing the intertrochanteric left hip fracture in near anatomic alignment. IMPRESSION: Internal fixation of intertrochanteric left hip fracture in near anatomic alignment. Electronically Signed   By: Myles Rosenthal M.D.   On: 05/16/2017 16:04   Dg Hip Unilat With Pelvis 2-3 Views Left  Result Date: 05/20/2017 CLINICAL DATA:  Left hip pain.  Prior history of hip surgery. EXAM: DG HIP (WITH OR WITHOUT PELVIS) 2-3V LEFT COMPARISON:  05/16/2017 . FINDINGS: Prior lumbosacral spine fusion. ORIF left proximal femur. Hardware intact. Anatomic alignment. Diffuse osteopenia. Degenerative changes both hips. Stool noted throughout the colon. Surgical clips in the pelvis. IMPRESSION: 1. Prior lumbosacral spine fusion. Prior ORIF left hip. Anatomic alignment. No acute abnormality. 2. Diffuse osteopenia degenerative change. Electronically Signed   By: Maisie Fus  Register   On: 05/20/2017 12:45   Dg Femur Min 2 Views Left  Result Date: 05/20/2017 CLINICAL DATA:  Pain.  Recent fracture with surgical fixation EXAM: LEFT FEMUR  2 VIEWS COMPARISON:  May 15, 2017 FINDINGS: Frontal and lateral views were obtained. There is screw and nail fixation through an intertrochanteric femur fracture on the left with alignment at the fracture site essentially anatomic. There is avulsion of the lesser trochanter on the left. There is a total knee replacement on the left with prosthetic components well-seated. No new fracture. No dislocation. No appreciable knee joint effusion. There is slight narrowing of the left hip joint. IMPRESSION: Postoperative change proximally with screw and nail fixation transfixing an intertrochanteric femur fracture. Avulsion of the lesser trochanter is noted. Alignment otherwise essentially anatomic. Status post total knee replacement with prosthetic components well-seated. No new fracture. No dislocation. Mild narrowing left hip joint. No knee joint effusion. Electronically Signed   By: Bretta Bang III M.D.   On: 05/20/2017 12:44   Dg Femur Min 2 Views Left  Result Date: 05/15/2017 CLINICAL DATA:  Fall down 3 steps with left hip pain. EXAM: LEFT FEMUR 2 VIEWS COMPARISON:  None. FINDINGS: Exam demonstrates diffuse osteopenia. There is a minimally displaced intertrochanteric fracture of the left  hip. Right knee arthroplasty is present. IMPRESSION: Minimally displaced intertrochanteric fracture of the left hip. Electronically Signed   By: Elberta Fortisaniel  Boyle M.D.   On: 05/15/2017 19:57    10/28 left hip fracture intramedullary nail, Dr. Romeo AppleHarrison   Subjective: Continues to complain of pain in hip.  No shortness of breath.  Discharge Exam: Vitals:   05/20/17 0622 05/20/17 0919  BP: (!) 125/40   Pulse: (!) 101   Resp: 15   Temp: 98.7 F (37.1 C)   SpO2: 97% 99%   Vitals:   05/19/17 2000 05/19/17 2118 05/20/17 0622 05/20/17 0919  BP: (!) 100/43  (!) 125/40   Pulse: (!) 106  (!) 101   Resp: 18  15   Temp: 98.7 F (37.1 C)  98.7 F (37.1 C)   TempSrc: Oral  Oral   SpO2: 95% 96% 97% 99%  Weight:       Height:        General: Pt is alert, awake, not in acute distress Cardiovascular: RRR, S1/S2 +, no rubs, no gallops Respiratory: CTA bilaterally, no wheezing, no rhonchi Abdominal: Soft, NT, ND, bowel sounds + Extremities: no edema, no cyanosis    The results of significant diagnostics from this hospitalization (including imaging, microbiology, ancillary and laboratory) are listed below for reference.     Microbiology: Recent Results (from the past 240 hour(s))  Surgical pcr screen     Status: None   Collection Time: 05/16/17 10:26 AM  Result Value Ref Range Status   MRSA, PCR NEGATIVE NEGATIVE Final   Staphylococcus aureus NEGATIVE NEGATIVE Final    Comment: (NOTE) The Xpert SA Assay (FDA approved for NASAL specimens in patients 65 years of age and older), is one component of a comprehensive surveillance program. It is not intended to diagnose infection nor to guide or monitor treatment.      Labs: BNP (last 3 results) No results for input(s): BNP in the last 8760 hours. Basic Metabolic Panel:  Recent Labs Lab 05/15/17 2021 05/16/17 0527 05/17/17 0511 05/18/17 0608 05/19/17 0436 05/20/17 0553  NA 136 136 133* 132* 136 136  K 3.4* 4.3 4.7 4.1 3.5 3.9  CL 103 105 103 102 100* 97*  CO2 23 24 23 27 29 30   GLUCOSE 95 103* 103* 110* 111* 107*  BUN 14 15 19 14 10 11   CREATININE 1.12* 1.15* 1.11* 0.76 0.64 0.65  CALCIUM 8.8* 8.5* 8.1* 8.1* 8.0* 8.7*  MG 2.0  --   --   --   --   --    Liver Function Tests: No results for input(s): AST, ALT, ALKPHOS, BILITOT, PROT, ALBUMIN in the last 168 hours. No results for input(s): LIPASE, AMYLASE in the last 168 hours. No results for input(s): AMMONIA in the last 168 hours. CBC:  Recent Labs Lab 05/15/17 2021 05/16/17 0527 05/17/17 0511 05/18/17 0608 05/19/17 0436 05/20/17 0553  WBC 12.7* 7.0 14.0* 6.6 6.7 7.1  NEUTROABS 10.1*  --   --   --   --   --   HGB 12.7 11.8* 9.4* 8.0* 7.5* 11.0*  HCT 38.0 37.0 29.3* 24.2*  22.4* 33.2*  MCV 94.3 95.6 98.0 94.9 94.1 94.1  PLT 222 211 205 151 161 148*   Cardiac Enzymes: No results for input(s): CKTOTAL, CKMB, CKMBINDEX, TROPONINI in the last 168 hours. BNP: Invalid input(s): POCBNP CBG: No results for input(s): GLUCAP in the last 168 hours. D-Dimer No results for input(s): DDIMER in the last 72 hours. Hgb A1c No results  for input(s): HGBA1C in the last 72 hours. Lipid Profile No results for input(s): CHOL, HDL, LDLCALC, TRIG, CHOLHDL, LDLDIRECT in the last 72 hours. Thyroid function studies No results for input(s): TSH, T4TOTAL, T3FREE, THYROIDAB in the last 72 hours.  Invalid input(s): FREET3 Anemia work up No results for input(s): VITAMINB12, FOLATE, FERRITIN, TIBC, IRON, RETICCTPCT in the last 72 hours. Urinalysis    Component Value Date/Time   COLORURINE YELLOW 05/12/2014 1620   APPEARANCEUR CLEAR 05/12/2014 1620   LABSPEC >1.030 (H) 05/12/2014 1620   PHURINE 6.0 05/12/2014 1620   GLUCOSEU NEGATIVE 05/12/2014 1620   HGBUR NEGATIVE 05/12/2014 1620   BILIRUBINUR NEGATIVE 05/12/2014 1620   KETONESUR NEGATIVE 05/12/2014 1620   PROTEINUR NEGATIVE 05/12/2014 1620   UROBILINOGEN 0.2 05/12/2014 1620   NITRITE NEGATIVE 05/12/2014 1620   LEUKOCYTESUR NEGATIVE 05/12/2014 1620   Sepsis Labs Invalid input(s): PROCALCITONIN,  WBC,  LACTICIDVEN Microbiology Recent Results (from the past 240 hour(s))  Surgical pcr screen     Status: None   Collection Time: 05/16/17 10:26 AM  Result Value Ref Range Status   MRSA, PCR NEGATIVE NEGATIVE Final   Staphylococcus aureus NEGATIVE NEGATIVE Final    Comment: (NOTE) The Xpert SA Assay (FDA approved for NASAL specimens in patients 77 years of age and older), is one component of a comprehensive surveillance program. It is not intended to diagnose infection nor to guide or monitor treatment.      Time coordinating discharge: Over 30 minutes  SIGNED:   Erick Blinks, MD  [facility] 05/20/2017,  3:00 PM Pager   If 7PM-7AM, please contact night-coverage [email or web address] Password [email or web address]

## 2017-05-20 NOTE — Progress Notes (Signed)
Physical Therapy Treatment Patient Details Name: Judy Snyder MRN: [id number] DOB: 01/05/1951 Today's Date: 05/20/2017    History of Present Illness Judy Snyder is a 66 y.o. female s/p INTRAMEDULLARY (IM) NAIL INTERTROCHANTRIC (Left) 05/16/17 with medical history significant of systolic CHF, COPD, fibromyalgia, chronic back pain, hyperlipidemia, hypertension, vertigo who is brought in to the emergency department after falling down 3 steps on her stairs at home, falling on her left side and injuring her left hip. She felt immediate pain and inability to bear weight on the extremity. She denies chest pain, palpitations, dizziness, diaphoresis, nausea, emesis or any other symptomatology prior to her fall. This was an accident per patient. She complains of frequent constipation, but denies abdominal pain, diarrhea, melena or hematochezia. Denies dysuria, frequency or hematuria.     PT Comments    Patient had difficulty lifting left hip against gravity when completing exercises and during gait training, c/o severe increase in pain left hip after patient stating she felt popping sound in left hip when attempting to walk back to bedside (see below) - RN notified and advised if patient continues to c/o left hip pain that intense an x+ray may be warranted.  Patient will benefit from continued physical therapy in hospital and recommended venue below to increase strength, balance, endurance for safe ADLs and gait.   Follow Up Recommendations  SNF;Supervision/Assistance - 24 hour     Equipment Recommendations  None recommended by PT    Recommendations for Other Services       Precautions / Restrictions Precautions Precautions: Fall Restrictions Weight Bearing Restrictions: Yes LLE Weight Bearing: Weight bearing as tolerated    Mobility  Bed Mobility Overal bed mobility: Needs Assistance Bed Mobility: Supine to Sit     Supine to sit: Mod assist Sit to supine: Mod assist   General bed  mobility comments: requires help to move LLE due c/o severe pain  Transfers Overall transfer level: Needs assistance Equipment used: Rolling walker (2 wheeled) Transfers: Sit to/from [employer]Stand;Stand Pivot Transfers Sit to Stand: Min assist;Mod assist Stand pivot transfers: Min assist;Mod assist          Ambulation/Gait Ambulation/Gait assistance: Min assist;Mod assist Ambulation Distance (Feet): 20 Feet Assistive device: Rolling walker (2 wheeled) Gait Pattern/deviations: Decreased step length - right;Decreased step length - left;Decreased stance time - left;Decreased stride length Gait velocity: slow Gait velocity interpretation: Below normal speed for age/gender General Gait Details: Patient demonstrates increased hip flexor strength for advancing LLE when taking steps, made it just past doorway, but unable to make it back to bedside due to c/o popping sound in left hip with severe increase in left hip pain, had to sit in chair and rolled back to bedside.   Stairs            Wheelchair Mobility    Modified Rankin (Stroke Patients Only)       Balance Overall balance assessment: Needs assistance Sitting-balance support: Bilateral upper extremity supported;Feet supported Sitting balance-Leahy Scale: Good     Standing balance support: Bilateral upper extremity supported;During functional activity Standing balance-Leahy Scale: Fair                              Cognition Arousal/Alertness: Awake/alert Behavior During Therapy: WFL for tasks assessed/performed Overall Cognitive Status: Within Functional Limits for tasks assessed  Exercises General Exercises - Lower Extremity Ankle Circles/Pumps: Seated;AROM;Strengthening;Both;10 reps Long Arc Quad: Seated;AROM;Strengthening;Left;10 reps Hip Flexion/Marching: Seated;AROM;Strengthening;Both;5 reps    General Comments        Pertinent Vitals/Pain  Pain Score: 8  Pain Location: left hip Pain Descriptors / Indicators: Sharp;Aching Pain Intervention(s): Limited activity within patient's tolerance;Monitored during session;Premedicated before session    Home Living                      Prior Function            PT Goals (current goals can now be found in the care plan section) Acute Rehab PT Goals Patient Stated Goal: Return home after rehab PT Goal Formulation: With patient/family Time For Goal Achievement: 05/27/17 Potential to Achieve Goals: Good Progress towards PT goals: Progressing toward goals    Frequency    Min 6X/week      PT Plan Current plan remains appropriate    Co-evaluation              AM-PAC PT "6 Clicks" Daily Activity  Outcome Measure  Difficulty turning over in bed (including adjusting bedclothes, sheets and blankets)?: A Lot Difficulty moving from lying on back to sitting on the side of the bed? : A Lot Difficulty sitting down on and standing up from a chair with arms (e.g., wheelchair, bedside commode, etc,.)?: A Lot Help needed moving to and from a bed to chair (including a wheelchair)?: A Lot Help needed walking in hospital room?: A Lot Help needed climbing 3-5 steps with a railing? : Total 6 Click Score: 11    End of Session Equipment Utilized During Treatment: Gait belt Activity Tolerance: Patient limited by pain;Patient tolerated treatment well Patient left: in bed;with call bell/phone within reach;with family/visitor present Nurse Communication: Mobility status PT Visit Diagnosis: Unsteadiness on feet (R26.81);Other abnormalities of gait and mobility (R26.89);Muscle weakness (generalized) (M62.81);Difficulty in walking, not elsewhere classified (R26.2)     Time: 6962-9528 PT Time Calculation (min) (ACUTE ONLY): 27 min  Charges:  $Therapeutic Activity: 23-37 mins                    G Codes:       1:56 PM, May 27, 2017 Ocie Bob, MPT Physical Therapist with  [facility] [phone] office [phone] mobile phone

## 2017-05-20 NOTE — Progress Notes (Signed)
Patient being [facility] today. Called report to Verdia KubaWanda Johnson, LPN. PICC Line d/c'd by Laretta BolsterMorgan Dishmon, RN. Pressure dressing in place, clean dry and intact. Educated patient regarding pressure dressing to stay in place for 24 hours and to notify nursing staff of any bleeding/drainage or discomfort to site. Verbalized understanding. Husband at bedside aware of discharge plan. EMS notified for transport. Stated may be after 7pm before transport occurs. Not[facility]anda [facility], stated would be fine if patient arrives later tonight. Nursing staff to notify them when patient is in route. Pt in stable condition awaiting discharge. Earnstine RegalAshley Anieya Helman, RN

## 2017-05-21 NOTE — Progress Notes (Signed)
Pt alert & responsive upon initial rounds; initial assessment completed; transporters to floor-[facility]-endorsement given to Chesapeake EnergyKatie Tipton,RN-pt w/o apparent distress prior to leaving the floor-accompanied to facility w/ spouse and transport service.

## 2017-05-27 ENCOUNTER — Telehealth: Payer: Self-pay | Admitting: Orthopedic Surgery

## 2017-05-27 NOTE — Telephone Encounter (Signed)
Patient's husband, Constance GoltzFred Vink (on patient's designated contact list) called, ph# [phone] - asked if he may speak with Dr Romeo AppleHarrison - relayed that he has concerns about [facility] - said he feels he can take care of patient at home, as far as therapy and medications, as he thinks they are not providing therapy every day, nor are they very responsive when patient "buzzes" for nurse.  States he has addressed his concerns with facility personnel, and said he has also tried to reach patient's primary care, Dr Olena LeatherwoodHasanaj, who has seen her at facility.  Please advise.

## 2017-05-28 NOTE — Telephone Encounter (Signed)
HE CAN REQUEST DC TO HOME  DR HASAJNI IS IN CONTROL OF THAT

## 2017-05-28 NOTE — Telephone Encounter (Signed)
He can request be discharged home with appropriate physical therapy 3 times a week

## 2017-05-28 NOTE — Telephone Encounter (Signed)
Relayed to patient's husband; voiced understanding.

## 2017-06-02 ENCOUNTER — Encounter: Payer: Self-pay | Admitting: Orthopedic Surgery

## 2017-06-02 ENCOUNTER — Ambulatory Visit (INDEPENDENT_AMBULATORY_CARE_PROVIDER_SITE_OTHER): Payer: Self-pay | Admitting: Orthopedic Surgery

## 2017-06-02 ENCOUNTER — Ambulatory Visit (INDEPENDENT_AMBULATORY_CARE_PROVIDER_SITE_OTHER): Payer: Medicare HMO

## 2017-06-02 VITALS — BP 109/69 | HR 95 | Ht 63.0 in | Wt 160.0 lb

## 2017-06-02 DIAGNOSIS — S72142D Displaced intertrochanteric fracture of left femur, subsequent encounter for closed fracture with routine healing: Secondary | ICD-10-CM | POA: Diagnosis not present

## 2017-06-02 DIAGNOSIS — Z4889 Encounter for other specified surgical aftercare: Secondary | ICD-10-CM

## 2017-06-02 MED ORDER — OXYCODONE-ACETAMINOPHEN 5-325 MG PO TABS: 1.0000 | ORAL_TABLET | ORAL | 0 refills | Status: DC | PRN

## 2017-06-02 NOTE — Progress Notes (Signed)
Encounter Diagnoses  Name Primary?  . Closed displaced intertrochanteric fracture of left femur with routine healing, subsequent encounter Yes  . Aftercare following surgery    Chief Complaint  Patient presents with  . Routine Post Op    hip fracture left IM Nail 05/16/17    Postop visit #1 this is postop day #17 open treatment internal fixation left hip with short gamma nail.  She did have an avulsion fracture of the lesser trochanter while she was doing therapy after surgery.  She is recovered well and has gone home from the nursing home.  She is progressing well with the walker full weightbearing.  Her staples were removed today her wounds look clean dry and intact she has minimal peripheral edema  Her x-ray today shows stable fixation of the left hip fracture with no complications  Recommend x-ray in 4 weeks  Meds ordered this encounter  Medications  . oxyCODONE-acetaminophen (PERCOCET/ROXICET) 5-325 MG tablet    Sig: Take 1 tablet every 4 (four) hours as needed by mouth for severe pain.    Dispense:  42 tablet    Refill:  0

## 2017-06-03 DIAGNOSIS — S7225XA Nondisplaced subtrochanteric fracture of left femur, initial encounter for closed fracture: Secondary | ICD-10-CM | POA: Diagnosis not present

## 2017-06-07 DIAGNOSIS — S7225XA Nondisplaced subtrochanteric fracture of left femur, initial encounter for closed fracture: Secondary | ICD-10-CM | POA: Diagnosis not present

## 2017-06-08 DIAGNOSIS — K210 Gastro-esophageal reflux disease with esophagitis: Secondary | ICD-10-CM | POA: Diagnosis not present

## 2017-06-08 DIAGNOSIS — I10 Essential (primary) hypertension: Secondary | ICD-10-CM | POA: Diagnosis not present

## 2017-06-08 DIAGNOSIS — M797 Fibromyalgia: Secondary | ICD-10-CM | POA: Diagnosis not present

## 2017-06-08 DIAGNOSIS — J441 Chronic obstructive pulmonary disease with (acute) exacerbation: Secondary | ICD-10-CM | POA: Diagnosis not present

## 2017-06-08 DIAGNOSIS — Z6827 Body mass index (BMI) 27.0-27.9, adult: Secondary | ICD-10-CM | POA: Diagnosis not present

## 2017-06-08 DIAGNOSIS — I2510 Atherosclerotic heart disease of native coronary artery without angina pectoris: Secondary | ICD-10-CM | POA: Diagnosis not present

## 2017-06-09 DIAGNOSIS — S7225XA Nondisplaced subtrochanteric fracture of left femur, initial encounter for closed fracture: Secondary | ICD-10-CM | POA: Diagnosis not present

## 2017-06-14 DIAGNOSIS — S7225XA Nondisplaced subtrochanteric fracture of left femur, initial encounter for closed fracture: Secondary | ICD-10-CM | POA: Diagnosis not present

## 2017-06-17 DIAGNOSIS — S7225XA Nondisplaced subtrochanteric fracture of left femur, initial encounter for closed fracture: Secondary | ICD-10-CM | POA: Diagnosis not present

## 2017-06-21 DIAGNOSIS — S7225XA Nondisplaced subtrochanteric fracture of left femur, initial encounter for closed fracture: Secondary | ICD-10-CM | POA: Diagnosis not present

## 2017-06-30 ENCOUNTER — Encounter: Payer: Self-pay | Admitting: Orthopedic Surgery

## 2017-06-30 ENCOUNTER — Ambulatory Visit (INDEPENDENT_AMBULATORY_CARE_PROVIDER_SITE_OTHER): Payer: Medicare HMO | Admitting: Orthopedic Surgery

## 2017-06-30 ENCOUNTER — Ambulatory Visit (INDEPENDENT_AMBULATORY_CARE_PROVIDER_SITE_OTHER): Payer: Medicare HMO

## 2017-06-30 VITALS — BP 128/85 | HR 83 | Ht 63.0 in | Wt 160.0 lb

## 2017-06-30 DIAGNOSIS — Z8781 Personal history of (healed) traumatic fracture: Secondary | ICD-10-CM

## 2017-06-30 DIAGNOSIS — Z967 Presence of other bone and tendon implants: Secondary | ICD-10-CM | POA: Diagnosis not present

## 2017-06-30 DIAGNOSIS — Z9889 Other specified postprocedural states: Secondary | ICD-10-CM

## 2017-06-30 DIAGNOSIS — S72142D Displaced intertrochanteric fracture of left femur, subsequent encounter for closed fracture with routine healing: Secondary | ICD-10-CM

## 2017-06-30 MED ORDER — HYDROCODONE-ACETAMINOPHEN 10-325 MG PO TABS: 1.0000 | ORAL_TABLET | ORAL | 0 refills | Status: DC | PRN

## 2017-06-30 NOTE — Progress Notes (Signed)
Postop visit   Status post open treatment internal fixation left hip fracture postop day #45 BP 128/85   Pulse 83   Ht 5\' 3"  (1.6 m)   Wt 160 lb (72.6 kg)   BMI 28.34 kg/m   Encounter Diagnoses  Name Primary?  . Closed displaced intertrochanteric fracture of left femur with routine healing, subsequent encounter Yes  . S/P ORIF (open reduction internal fixation) fracture 05/16/17    Weightbearing status as tolerated  Today's films show stable fixation appropriate healing no complications from the hardware  Patient is ambulatory weightbearing as tolerated with a walker  Meds ordered this encounter  Medications  . HYDROcodone-acetaminophen (NORCO) 10-325 MG tablet    Sig: Take 1 tablet by mouth every 4 (four) hours as needed.    Dispense:  42 tablet    Refill:  0    Fu 6 weeks with xrays

## 2017-07-01 DIAGNOSIS — S7225XA Nondisplaced subtrochanteric fracture of left femur, initial encounter for closed fracture: Secondary | ICD-10-CM | POA: Diagnosis not present

## 2017-07-05 DIAGNOSIS — S7225XA Nondisplaced subtrochanteric fracture of left femur, initial encounter for closed fracture: Secondary | ICD-10-CM | POA: Diagnosis not present

## 2017-07-16 DIAGNOSIS — J441 Chronic obstructive pulmonary disease with (acute) exacerbation: Secondary | ICD-10-CM | POA: Diagnosis not present

## 2017-07-16 DIAGNOSIS — K210 Gastro-esophageal reflux disease with esophagitis: Secondary | ICD-10-CM | POA: Diagnosis not present

## 2017-07-16 DIAGNOSIS — I10 Essential (primary) hypertension: Secondary | ICD-10-CM | POA: Diagnosis not present

## 2017-08-11 ENCOUNTER — Ambulatory Visit (INDEPENDENT_AMBULATORY_CARE_PROVIDER_SITE_OTHER): Payer: Medicare HMO

## 2017-08-11 ENCOUNTER — Encounter: Payer: Self-pay | Admitting: Orthopedic Surgery

## 2017-08-11 ENCOUNTER — Ambulatory Visit (INDEPENDENT_AMBULATORY_CARE_PROVIDER_SITE_OTHER): Payer: Self-pay | Admitting: Orthopedic Surgery

## 2017-08-11 VITALS — BP 128/74 | HR 77 | Ht 63.0 in | Wt 153.0 lb

## 2017-08-11 DIAGNOSIS — Z967 Presence of other bone and tendon implants: Secondary | ICD-10-CM

## 2017-08-11 DIAGNOSIS — S72142D Displaced intertrochanteric fracture of left femur, subsequent encounter for closed fracture with routine healing: Secondary | ICD-10-CM

## 2017-08-11 DIAGNOSIS — Z4889 Encounter for other specified surgical aftercare: Secondary | ICD-10-CM

## 2017-08-11 DIAGNOSIS — Z9889 Other specified postprocedural states: Secondary | ICD-10-CM

## 2017-08-11 DIAGNOSIS — Z8781 Personal history of (healed) traumatic fracture: Secondary | ICD-10-CM

## 2017-08-11 NOTE — Progress Notes (Signed)
POST OP   Chief Complaint  Patient presents with  . Post-op Follow-up    IM nail for left intertrochanteric fracture on 05/15/17    DAY # 88  Currently on a rolling walker attempting to use a cane at times feels a little unsteady  Has good hip flexion knee extension  X-rays show fracture healing  Recommend 7934-month follow-up checkup

## 2017-10-06 ENCOUNTER — Ambulatory Visit: Payer: Medicare Other | Admitting: Orthopedic Surgery

## 2017-10-06 ENCOUNTER — Encounter: Payer: Self-pay | Admitting: Orthopedic Surgery

## 2017-10-06 ENCOUNTER — Ambulatory Visit (INDEPENDENT_AMBULATORY_CARE_PROVIDER_SITE_OTHER): Payer: Medicare Other

## 2017-10-06 VITALS — BP 116/75 | HR 96 | Ht 63.0 in | Wt 154.0 lb

## 2017-10-06 DIAGNOSIS — Z8781 Personal history of (healed) traumatic fracture: Secondary | ICD-10-CM | POA: Diagnosis not present

## 2017-10-06 DIAGNOSIS — Z96612 Presence of left artificial shoulder joint: Secondary | ICD-10-CM | POA: Diagnosis not present

## 2017-10-06 DIAGNOSIS — S72142D Displaced intertrochanteric fracture of left femur, subsequent encounter for closed fracture with routine healing: Secondary | ICD-10-CM

## 2017-10-06 DIAGNOSIS — Z9889 Other specified postprocedural states: Secondary | ICD-10-CM

## 2017-10-06 DIAGNOSIS — Z967 Presence of other bone and tendon implants: Secondary | ICD-10-CM

## 2017-10-06 DIAGNOSIS — M25512 Pain in left shoulder: Secondary | ICD-10-CM

## 2017-10-06 NOTE — Progress Notes (Signed)
Progress Note   Patient ID: Judy Snyder, female   DOB: 06/26/1951, 67 y.o.   MRN: [id number]  Chief Complaint  Patient presents with  . Shoulder Pain    left shoulder pain with decreased ROM   . Post-op Follow-up    left hip improving    66 status post IM nail left hip fracture October 2018 presents for routine follow-up complains now of decreased range of motion left shoulder S/P previous left hemiarthroplasty of the shoulder thousand 5  Patient says she noticed 1 month ago decreased range of motion increasing dull aching pain left shoulder.  No associated trauma no numbness or tingling just inability to raise the arm above her head     Review of Systems  Constitutional: Negative for fever.  Musculoskeletal: Positive for neck pain.  Skin: Negative.   Neurological: Negative for tingling.   Past Medical History:  Diagnosis Date  . CHF (congestive heart failure) (HCC)   . COPD (chronic obstructive pulmonary disease) (HCC)   . Fibromyalgia   . High cholesterol   . Hypertension   . Vertigo    Past Surgical History:  Procedure Laterality Date  . ANTERIOR FUSION CERVICAL SPINE    . BACK SURGERY    . INTRAMEDULLARY (IM) NAIL INTERTROCHANTERIC Left 05/16/2017   Procedure: INTRAMEDULLARY (IM) NAIL INTERTROCHANTRIC;  Surgeon: Vickki HearingHarrison, Stanley E, MD;  Location: [facility];  Service: Orthopedics;  Laterality: Left;  . KNEE SURGERY    . SHOULDER SURGERY Left    Shoulder replacement  . SHOULDER SURGERY Right    Shoulder replacement ?                                  Current Meds  Medication Sig  . acetaminophen (TYLENOL) 650 MG CR tablet Take 650 mg by mouth every 8 (eight) hours as needed for pain.  Marland Kitchen. albuterol (PROVENTIL HFA;VENTOLIN HFA) 108 (90 BASE) MCG/ACT inhaler Inhale 2 puffs into the lungs every 4 (four) hours as needed for wheezing or shortness of breath.  Marland Kitchen. alendronate (FOSAMAX) 70 MG tablet Take 1 tablet by mouth once a week.  Marland Kitchen. aspirin EC 325 MG tablet Take 1  tablet (325 mg total) by mouth daily.  Marland Kitchen. BREO ELLIPTA 200-25 MCG/INH AEPB Inhale 1 puff into the lungs daily.  . Cholecalciferol (VITAMIN D3 PO) Take 1 capsule by mouth daily.  Marland Kitchen. lubiprostone (AMITIZA) 24 MCG capsule Take 24 mcg by mouth daily as needed for constipation.  . methocarbamol (ROBAXIN) 500 MG tablet Take 1 tablet (500 mg total) by mouth 3 (three) times daily.  . metoprolol tartrate (LOPRESSOR) 25 MG tablet Take 0.5 tablets (12.5 mg total) by mouth 2 (two) times daily.  . Milnacipran HCl (SAVELLA) 100 MG TABS tablet Take 100 mg by mouth 2 (two) times daily.  . Omega-3 Fatty Acids (FISH OIL PO) Take 1 capsule by mouth daily.  Marland Kitchen. omeprazole (PRILOSEC) 40 MG capsule Take 40 mg by mouth daily.  . ranolazine (RANEXA) 1000 MG SR tablet Take 1,000 mg by mouth daily.   Marland Kitchen. senna (SENOKOT) 8.6 MG TABS tablet Take 1 tablet (8.6 mg total) by mouth 2 (two) times daily.  Marland Kitchen. thiamine (VITAMIN B-1) 100 MG tablet Take 100 mg by mouth daily.  Marland Kitchen. tiotropium (SPIRIVA) 18 MCG inhalation capsule Place 18 mcg into inhaler and inhale daily.    No Known Allergies   BP 116/75   Pulse 96   Ht 5'  3" (1.6 m)   Wt 154 lb (69.9 kg)   BMI 27.28 kg/m   Physical Exam  Constitutional: She is oriented to person, place, and time. She appears well-developed and well-nourished.  Musculoskeletal:       Arms:      Legs: Neurological: She is alert and oriented to person, place, and time.  Gilmer Mor is required for gait  Psychiatric: She has a normal mood and affect. Judgment normal.  Vitals reviewed.    Medical decision-making Encounter Diagnoses  Name Primary?  . S/P ORIF (open reduction internal fixation) fracture IM nail 05/16/17 Yes  . Closed displaced intertrochanteric fracture of left femur with routine healing, subsequent encounter   . Acute pain of left shoulder    X-ray left shoulder hemiarthroplasty no loosening humeral head to acromial distance about 4-1/2 mm which is a little short or decreased  versus what would expect for normal.  Cannot really assess glenoid erosion there appears to be adequate joint space there.  The inferior humeral line the inferior glenoid appears to be normal  Suspect rotator cuff insufficiency versus bursitis  Recommend injection range of motion exercises reassess in 6-8 weeks if no improvement MRI may be needed to assess her rotator cuff.  Procedure note the subacromial injection shoulder left   Verbal consent was obtained to inject the  Left   Shoulder  Timeout was completed to confirm the injection site is a subacromial space of the  left  shoulder  Medication used Depo-Medrol 40 mg and lidocaine 1% 3 cc  Anesthesia was provided by ethyl chloride  The injection was performed in the left  posterior subacromial space. After pinning the skin with alcohol and anesthetized the skin with ethyl chloride the subacromial space was injected using a 20-gauge needle. There were no complications  Sterile dressing was applied.   Fuller Canada, MD 10/06/2017 10:29 AM

## 2017-10-06 NOTE — Patient Instructions (Addendum)

## 2017-10-11 DIAGNOSIS — K210 Gastro-esophageal reflux disease with esophagitis: Secondary | ICD-10-CM | POA: Diagnosis not present

## 2017-10-11 DIAGNOSIS — M797 Fibromyalgia: Secondary | ICD-10-CM | POA: Diagnosis not present

## 2017-10-11 DIAGNOSIS — I10 Essential (primary) hypertension: Secondary | ICD-10-CM | POA: Diagnosis not present

## 2017-10-11 DIAGNOSIS — J441 Chronic obstructive pulmonary disease with (acute) exacerbation: Secondary | ICD-10-CM | POA: Diagnosis not present

## 2017-12-01 ENCOUNTER — Ambulatory Visit: Payer: Self-pay | Admitting: Orthopedic Surgery

## 2017-12-02 DIAGNOSIS — Z96612 Presence of left artificial shoulder joint: Secondary | ICD-10-CM | POA: Insufficient documentation

## 2017-12-03 ENCOUNTER — Ambulatory Visit: Payer: Self-pay | Admitting: Orthopedic Surgery

## 2017-12-03 NOTE — Progress Notes (Deleted)
Progress Note   Patient ID: Judy Snyder, female   DOB: 08/30/1950, 67 y.o.   MRN: [id number]  No chief complaint on file.    Medical decision-making Encounter Diagnoses  Name Primary?  . History of left shoulder replacement 2005 [facility]    . Acute pain of left shoulder Yes     PLAN: ***    No orders of the defined types were placed in this encounter.        No chief complaint on file.   HPI   ROS No outpatient medications have been marked as taking for the 12/03/17 encounter (Appointment) with Vickki Hearing, MD.    No Known Allergies   There were no vitals taken for this visit.  Physical Exam     Judy Canada, MD 12/03/2017 8:08 AM

## 2018-01-10 DIAGNOSIS — Z0000 Encounter for general adult medical examination without abnormal findings: Secondary | ICD-10-CM | POA: Diagnosis not present

## 2018-01-10 DIAGNOSIS — M797 Fibromyalgia: Secondary | ICD-10-CM | POA: Diagnosis not present

## 2018-01-10 DIAGNOSIS — K210 Gastro-esophageal reflux disease with esophagitis: Secondary | ICD-10-CM | POA: Diagnosis not present

## 2018-01-10 DIAGNOSIS — I10 Essential (primary) hypertension: Secondary | ICD-10-CM | POA: Diagnosis not present

## 2018-01-10 DIAGNOSIS — J441 Chronic obstructive pulmonary disease with (acute) exacerbation: Secondary | ICD-10-CM | POA: Diagnosis not present

## 2018-04-21 DIAGNOSIS — Z1389 Encounter for screening for other disorder: Secondary | ICD-10-CM | POA: Diagnosis not present

## 2018-04-21 DIAGNOSIS — Z0000 Encounter for general adult medical examination without abnormal findings: Secondary | ICD-10-CM | POA: Diagnosis not present

## 2018-04-21 DIAGNOSIS — K210 Gastro-esophageal reflux disease with esophagitis: Secondary | ICD-10-CM | POA: Diagnosis not present

## 2018-04-21 DIAGNOSIS — I10 Essential (primary) hypertension: Secondary | ICD-10-CM | POA: Diagnosis not present

## 2018-04-21 DIAGNOSIS — J441 Chronic obstructive pulmonary disease with (acute) exacerbation: Secondary | ICD-10-CM | POA: Diagnosis not present

## 2018-04-21 DIAGNOSIS — M797 Fibromyalgia: Secondary | ICD-10-CM | POA: Diagnosis not present

## 2018-04-26 DIAGNOSIS — M797 Fibromyalgia: Secondary | ICD-10-CM | POA: Diagnosis not present

## 2018-04-26 DIAGNOSIS — I10 Essential (primary) hypertension: Secondary | ICD-10-CM | POA: Diagnosis not present

## 2018-04-26 DIAGNOSIS — K210 Gastro-esophageal reflux disease with esophagitis: Secondary | ICD-10-CM | POA: Diagnosis not present

## 2018-07-06 DIAGNOSIS — M797 Fibromyalgia: Secondary | ICD-10-CM | POA: Diagnosis not present

## 2018-07-06 DIAGNOSIS — I10 Essential (primary) hypertension: Secondary | ICD-10-CM | POA: Diagnosis not present

## 2018-07-06 DIAGNOSIS — K210 Gastro-esophageal reflux disease with esophagitis: Secondary | ICD-10-CM | POA: Diagnosis not present

## 2018-07-25 DIAGNOSIS — Z0000 Encounter for general adult medical examination without abnormal findings: Secondary | ICD-10-CM | POA: Diagnosis not present

## 2018-07-25 DIAGNOSIS — M797 Fibromyalgia: Secondary | ICD-10-CM | POA: Diagnosis not present

## 2018-07-25 DIAGNOSIS — I10 Essential (primary) hypertension: Secondary | ICD-10-CM | POA: Diagnosis not present

## 2018-07-25 DIAGNOSIS — K210 Gastro-esophageal reflux disease with esophagitis: Secondary | ICD-10-CM | POA: Diagnosis not present

## 2018-07-25 DIAGNOSIS — J441 Chronic obstructive pulmonary disease with (acute) exacerbation: Secondary | ICD-10-CM | POA: Diagnosis not present

## 2018-08-01 DIAGNOSIS — I10 Essential (primary) hypertension: Secondary | ICD-10-CM | POA: Diagnosis not present

## 2018-08-01 DIAGNOSIS — J449 Chronic obstructive pulmonary disease, unspecified: Secondary | ICD-10-CM | POA: Diagnosis not present

## 2018-08-01 DIAGNOSIS — K210 Gastro-esophageal reflux disease with esophagitis: Secondary | ICD-10-CM | POA: Diagnosis not present

## 2018-08-01 DIAGNOSIS — M797 Fibromyalgia: Secondary | ICD-10-CM | POA: Diagnosis not present

## 2018-10-17 IMAGING — DX DG ANKLE COMPLETE 3+V*L*
3 series · 3 of 3 positions shown · non-contrast
Comparison: None.

CLINICAL DATA: Fall down 3 steps with left ankle pain.

EXAM:
LEFT ANKLE COMPLETE - 3+ VIEW

[ankle ap]
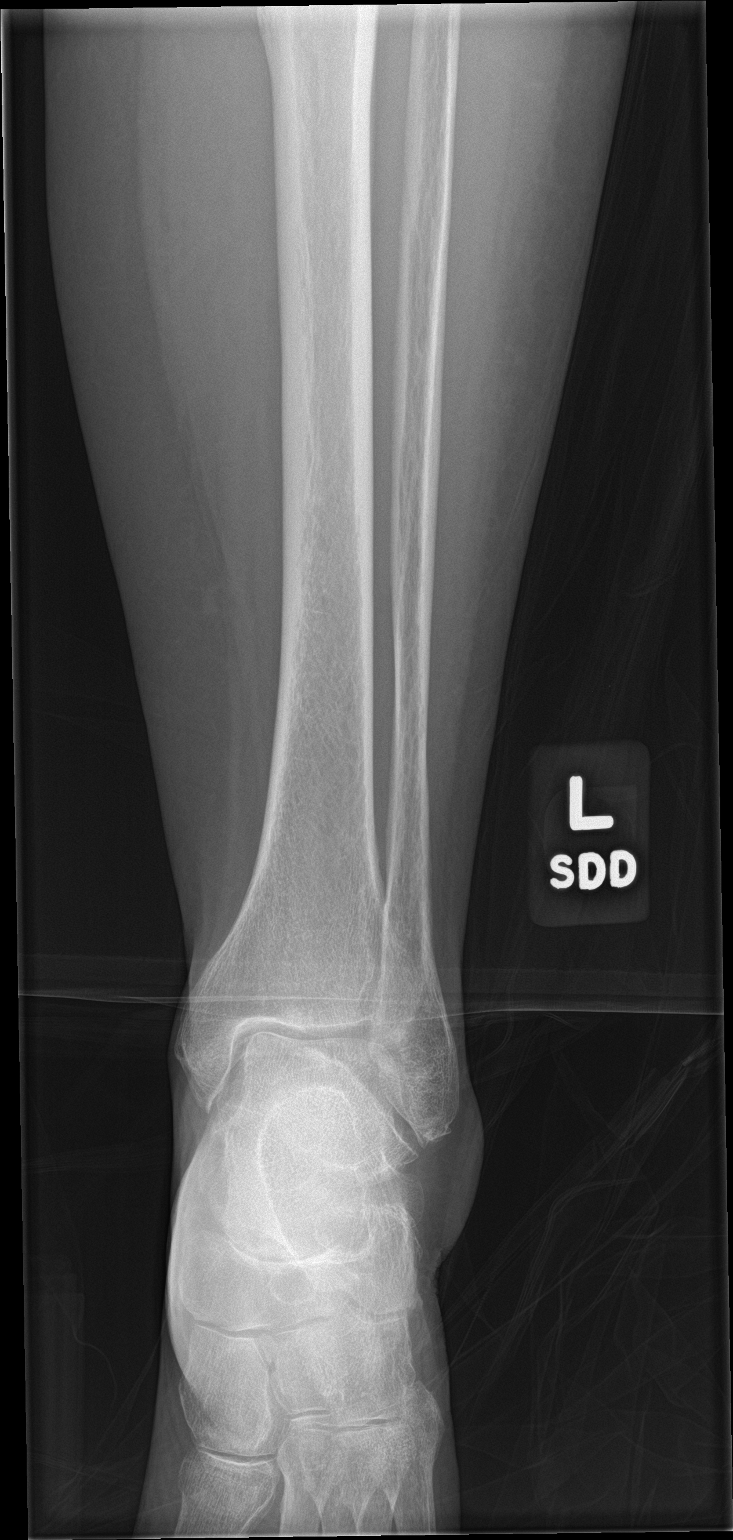

[ankle obl (1 of 2)]
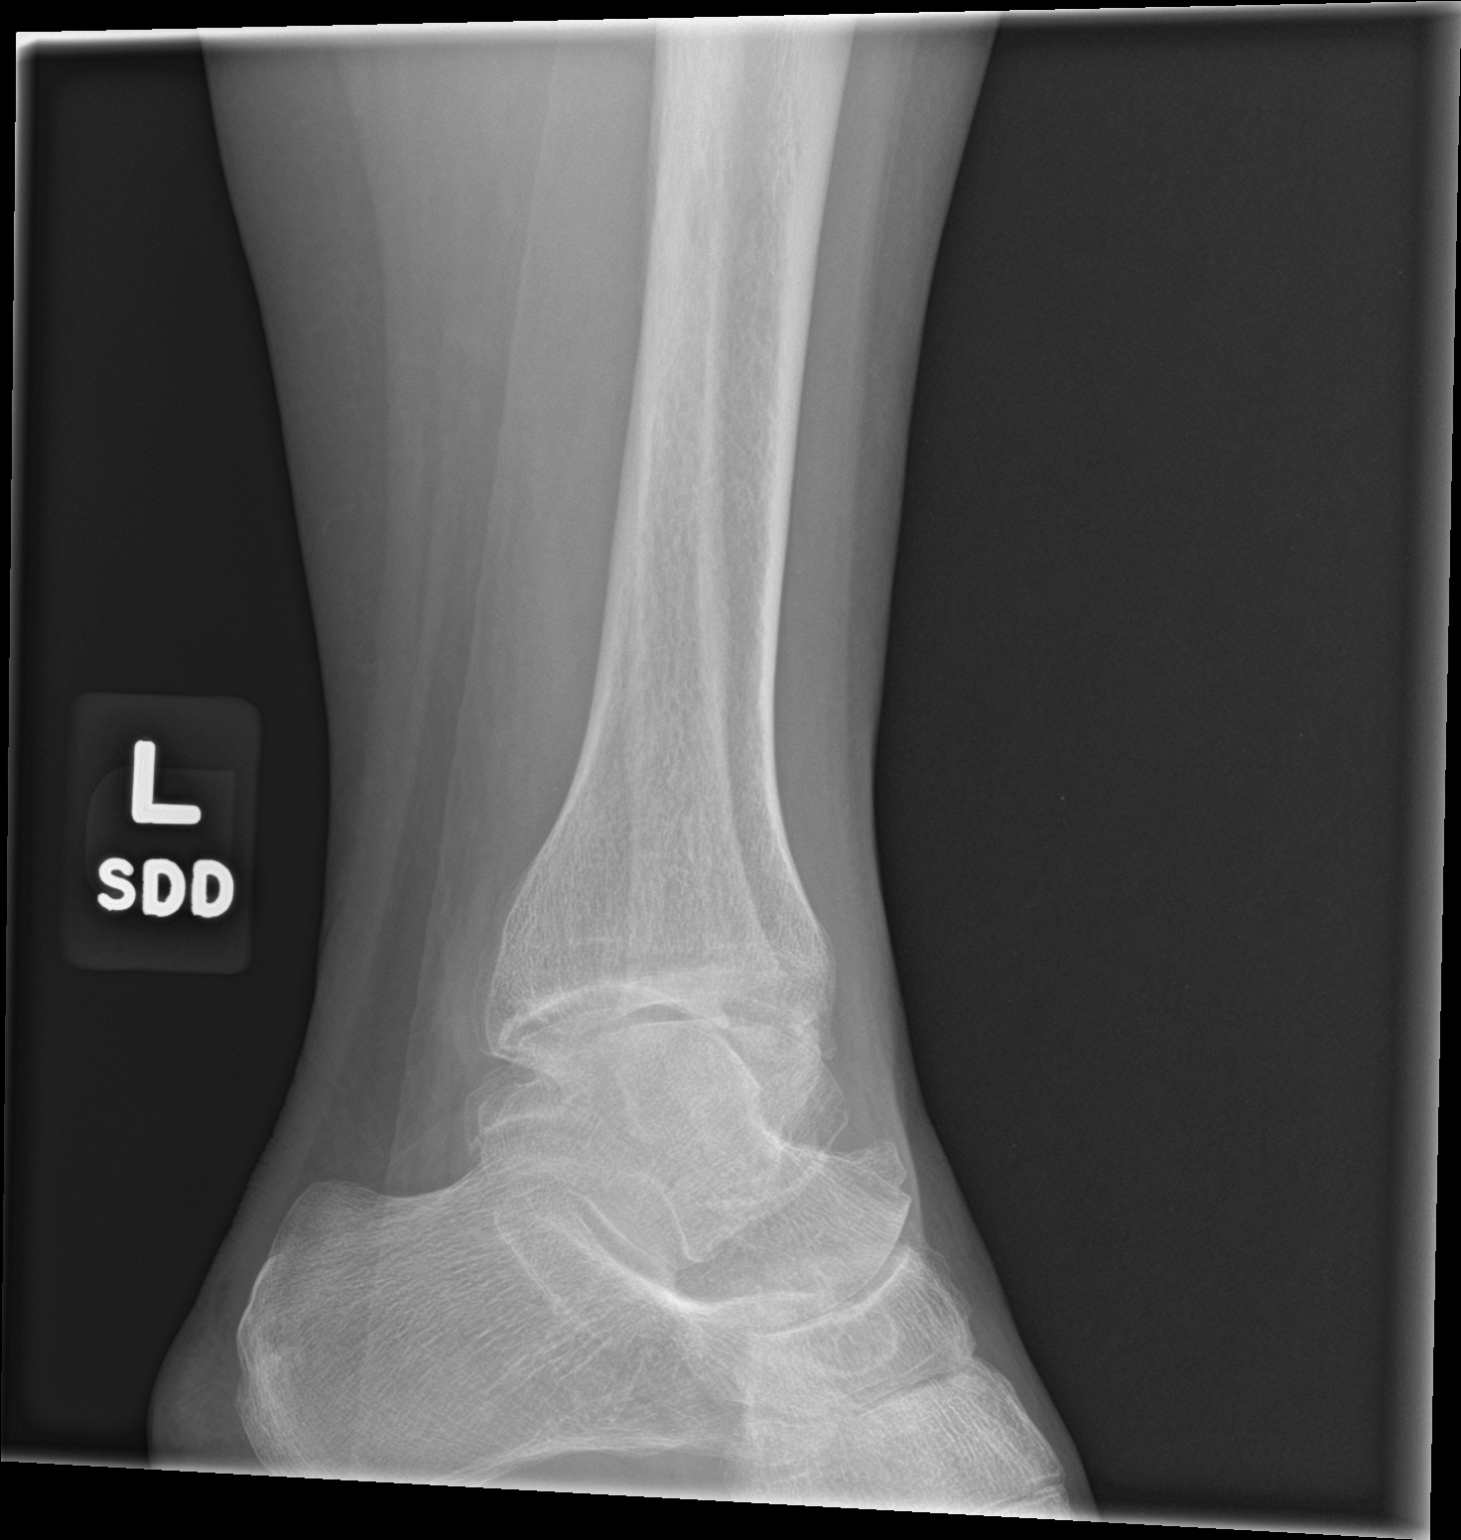

[ankle obl (2 of 2)]
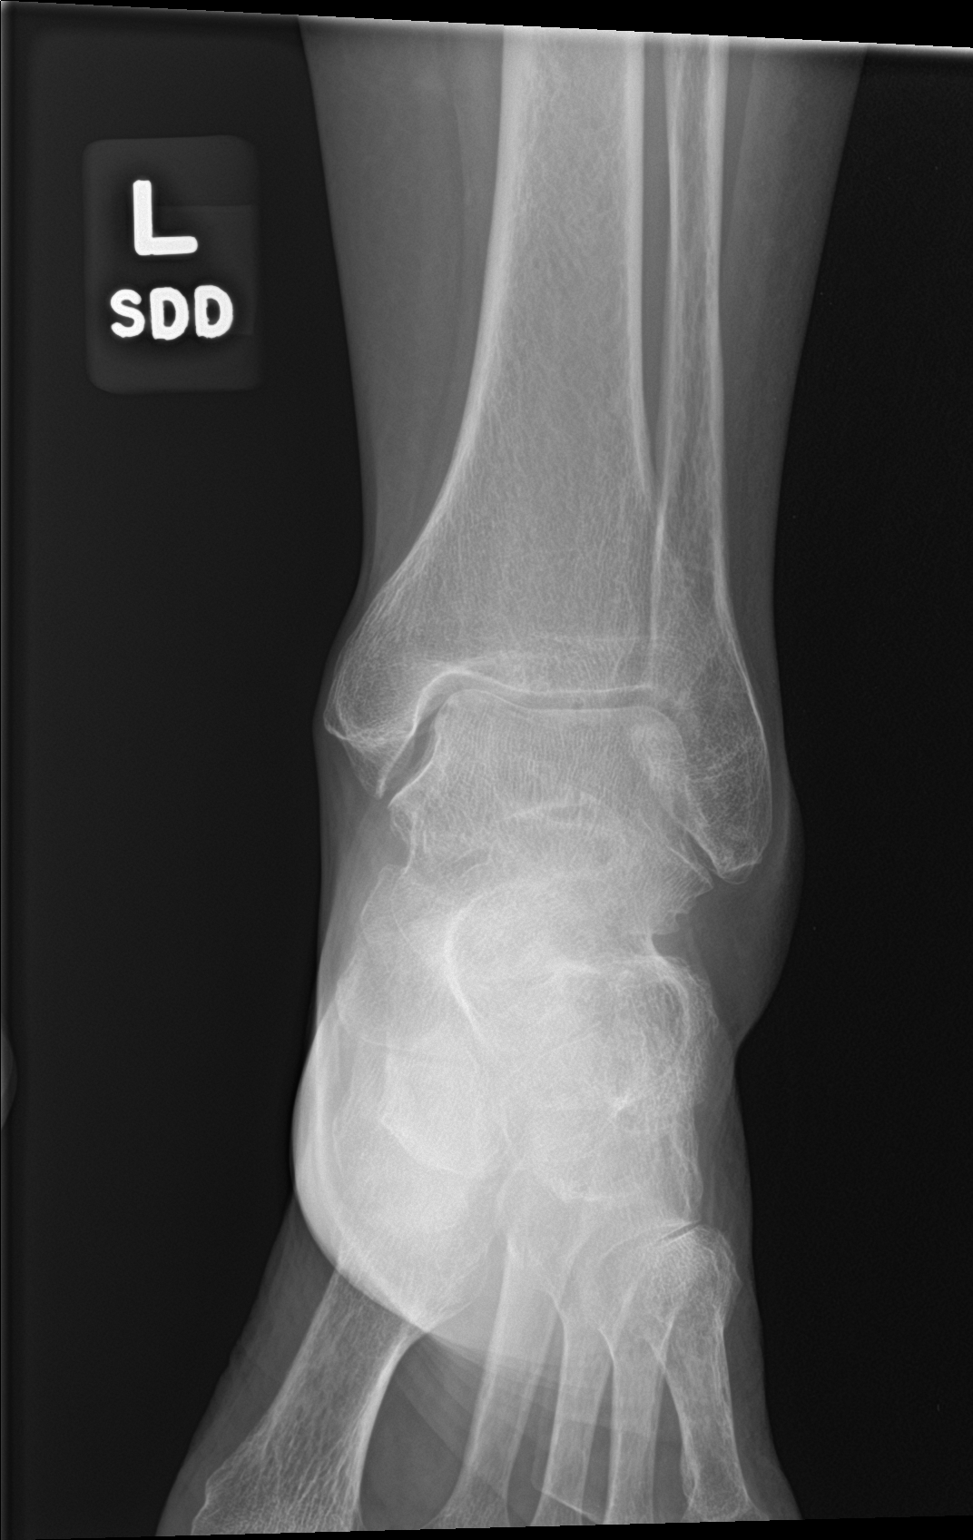

[3 of 3 positions shown; findings below may reference images not displayed]

FINDINGS: Diffuse osteopenia. No acute fracture. Ankle mortise is within
normal. Possible abnormal rotation of the talus on the lateral film.
IMPRESSION: No acute fracture identified. Possible abnormal rotation of the
talus on the lateral film. Consider left foot series for further
evaluation.

## 2018-10-18 IMAGING — CR DG PELVIS 1-2V
1 series · 2 of 2 positions shown · non-contrast
Comparison: 05/15/2017

CLINICAL DATA: Left hip fracture.  Postop from internal fixation.

EXAM:
PELVIS - 1-2 VIEW

[Series 2: ap · 0.17mm/px · 2 of 2 slices shown]
[im 1/2]
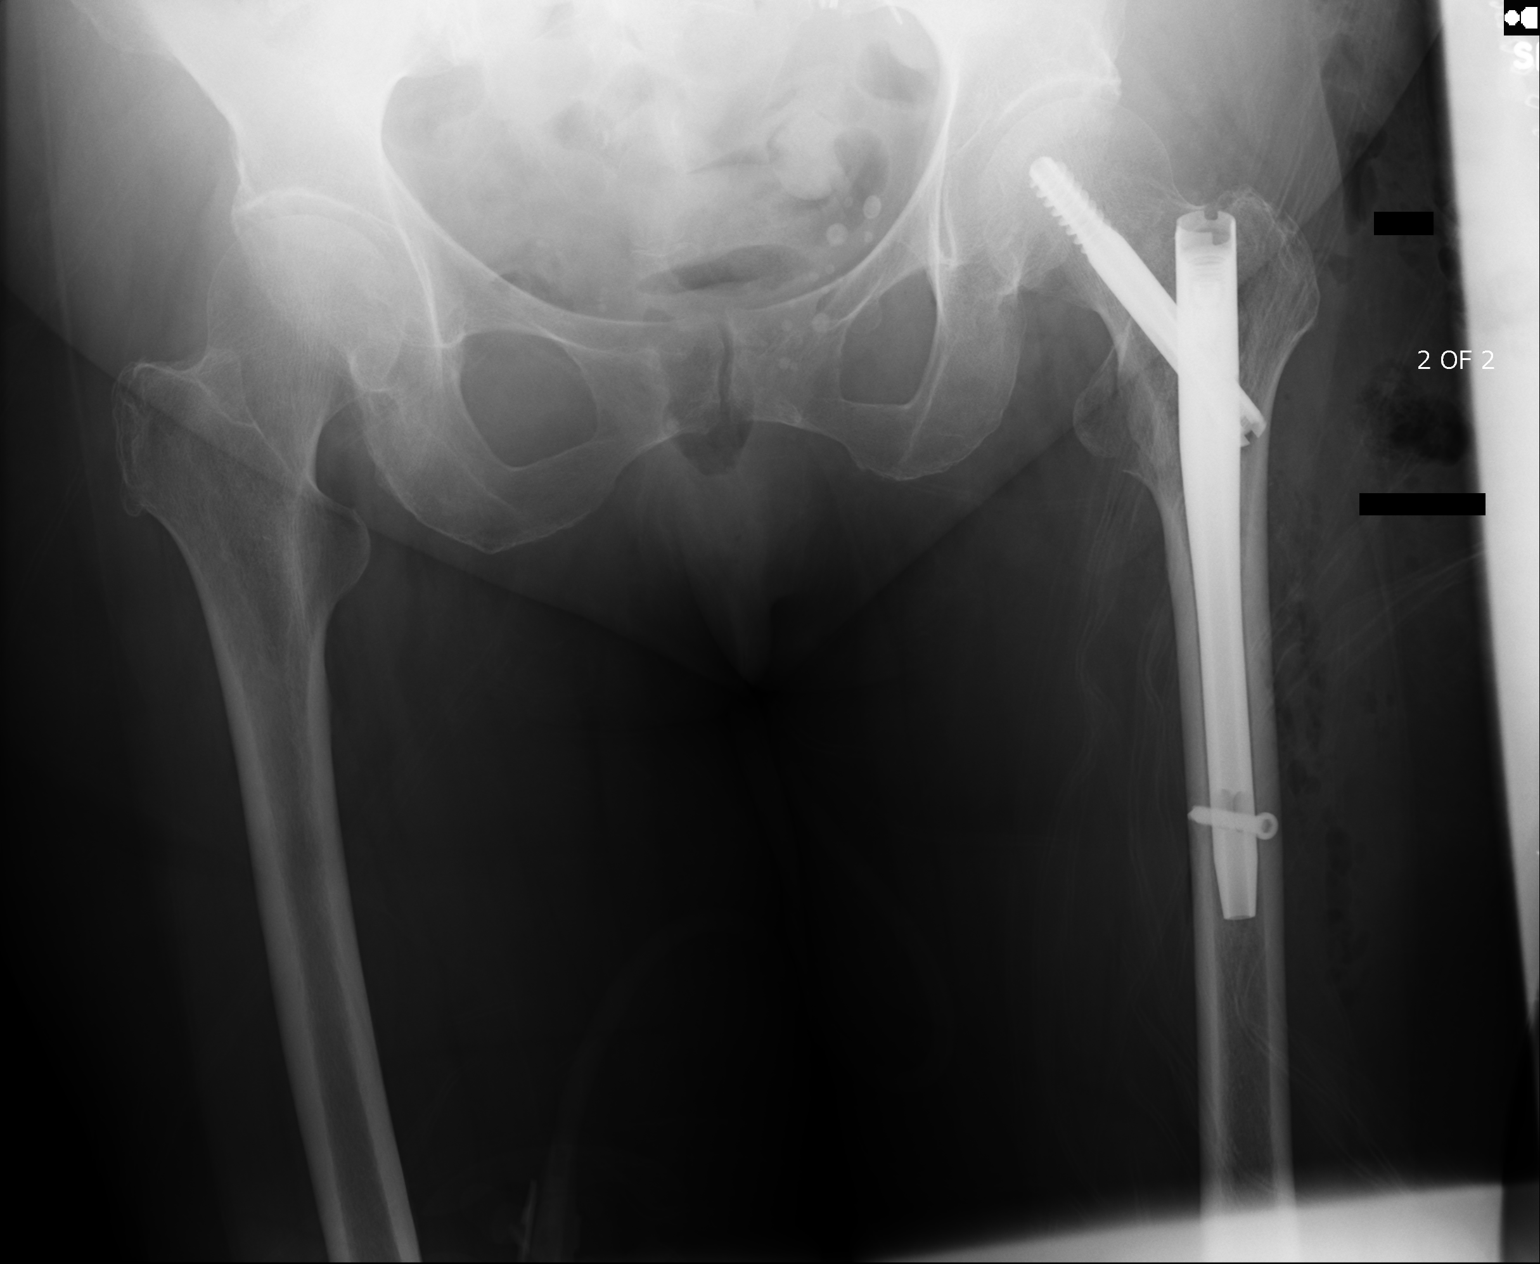
[im 2/2]
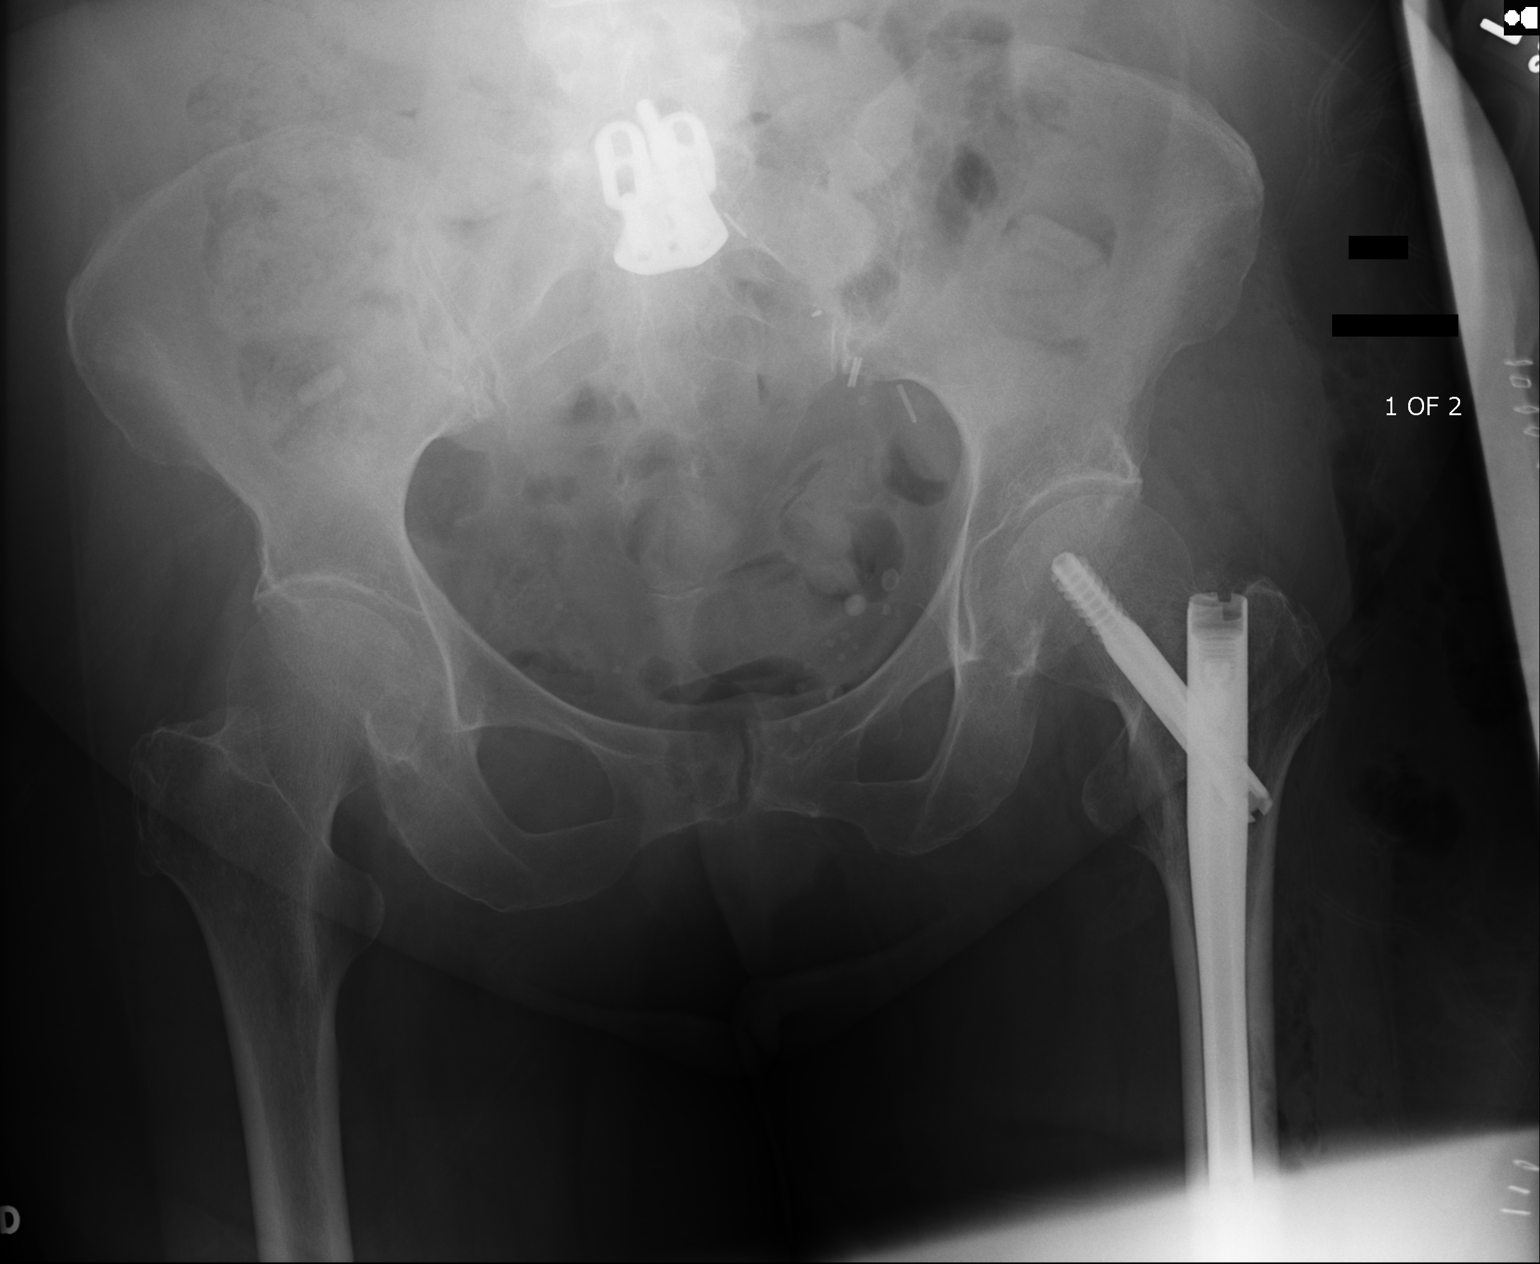

[2 of 2 positions shown; findings below may reference images not displayed]

FINDINGS: Interlocking compression screw and intramedullary rod fixation of
intertrochanteric left hip fracture seen which is in near anatomic
alignment. No other osseous abnormality identified.
IMPRESSION: Internal fixation of intertrochanteric left hip fracture in near
anatomic alignment.

## 2018-10-22 IMAGING — DX DG HIP (WITH OR WITHOUT PELVIS) 2-3V*L*
3 series · 3 of 3 positions shown · non-contrast
Comparison: 05/16/2017 .

CLINICAL DATA: Left hip pain.  Prior history of hip surgery.

EXAM:
DG HIP (WITH OR WITHOUT PELVIS) 2-3V LEFT

[pelvis ap]
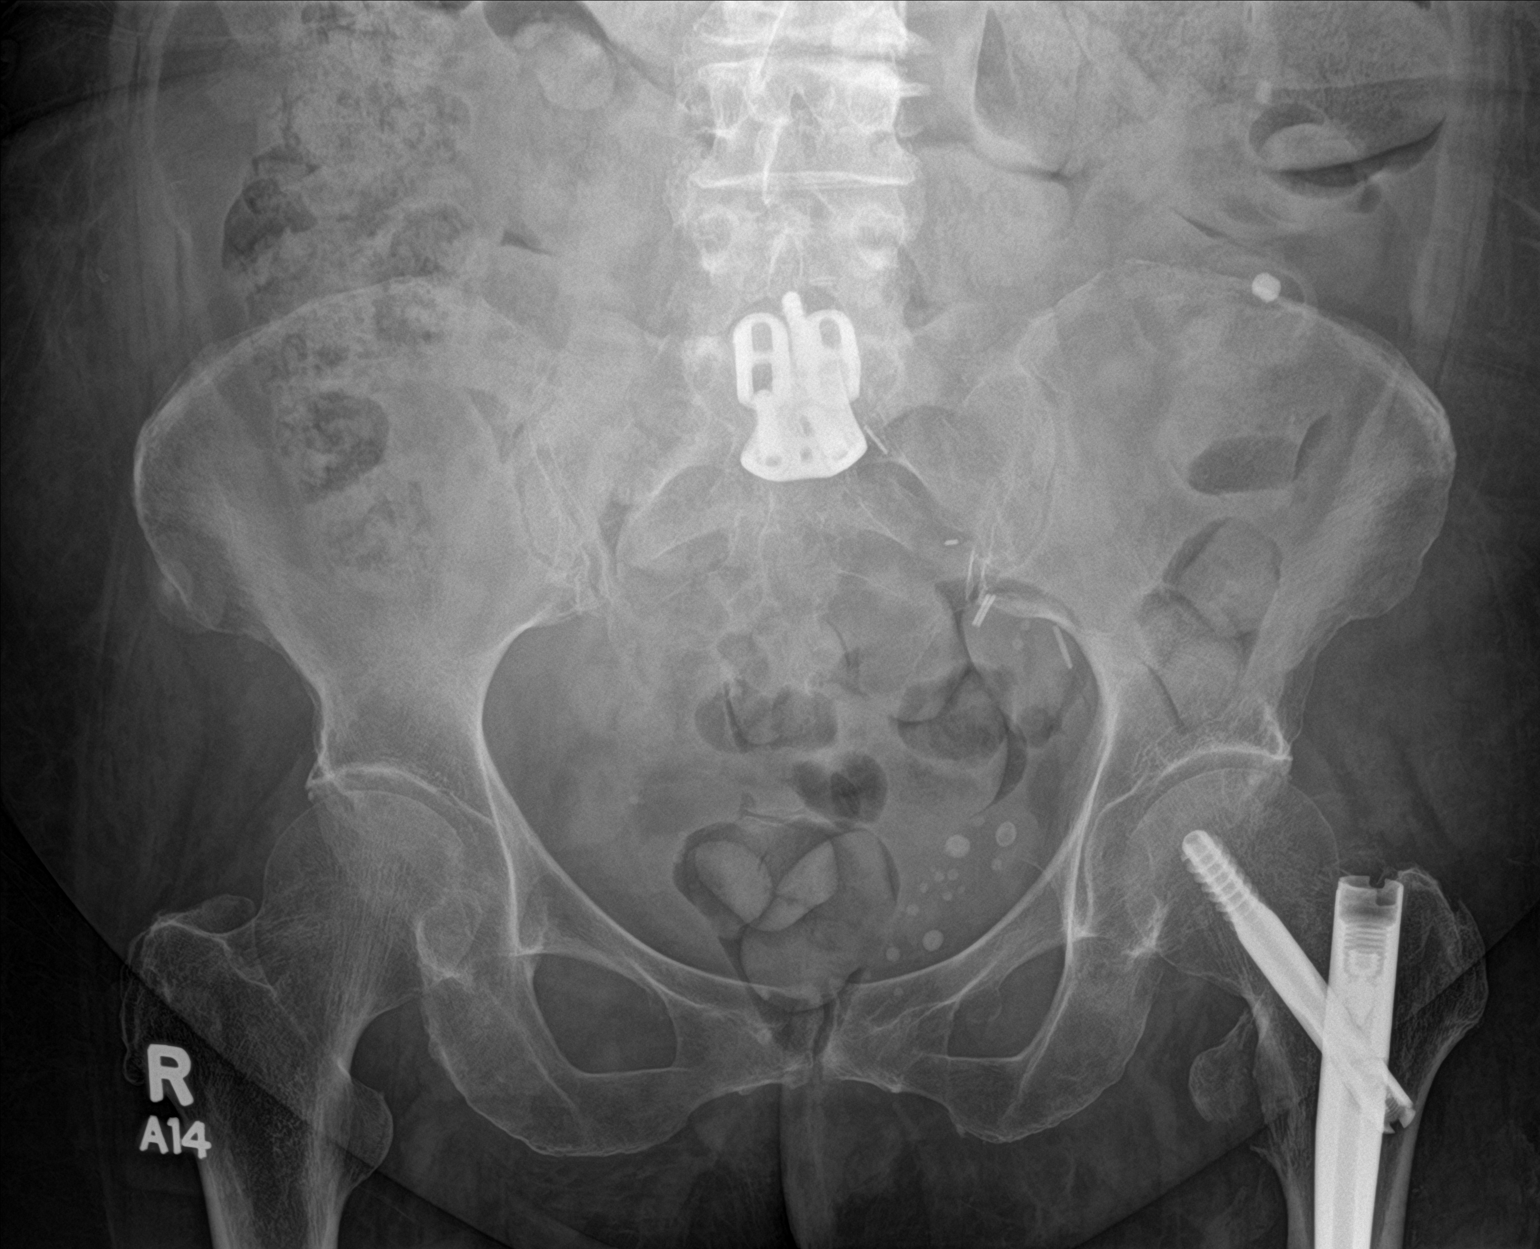

[hip ap]
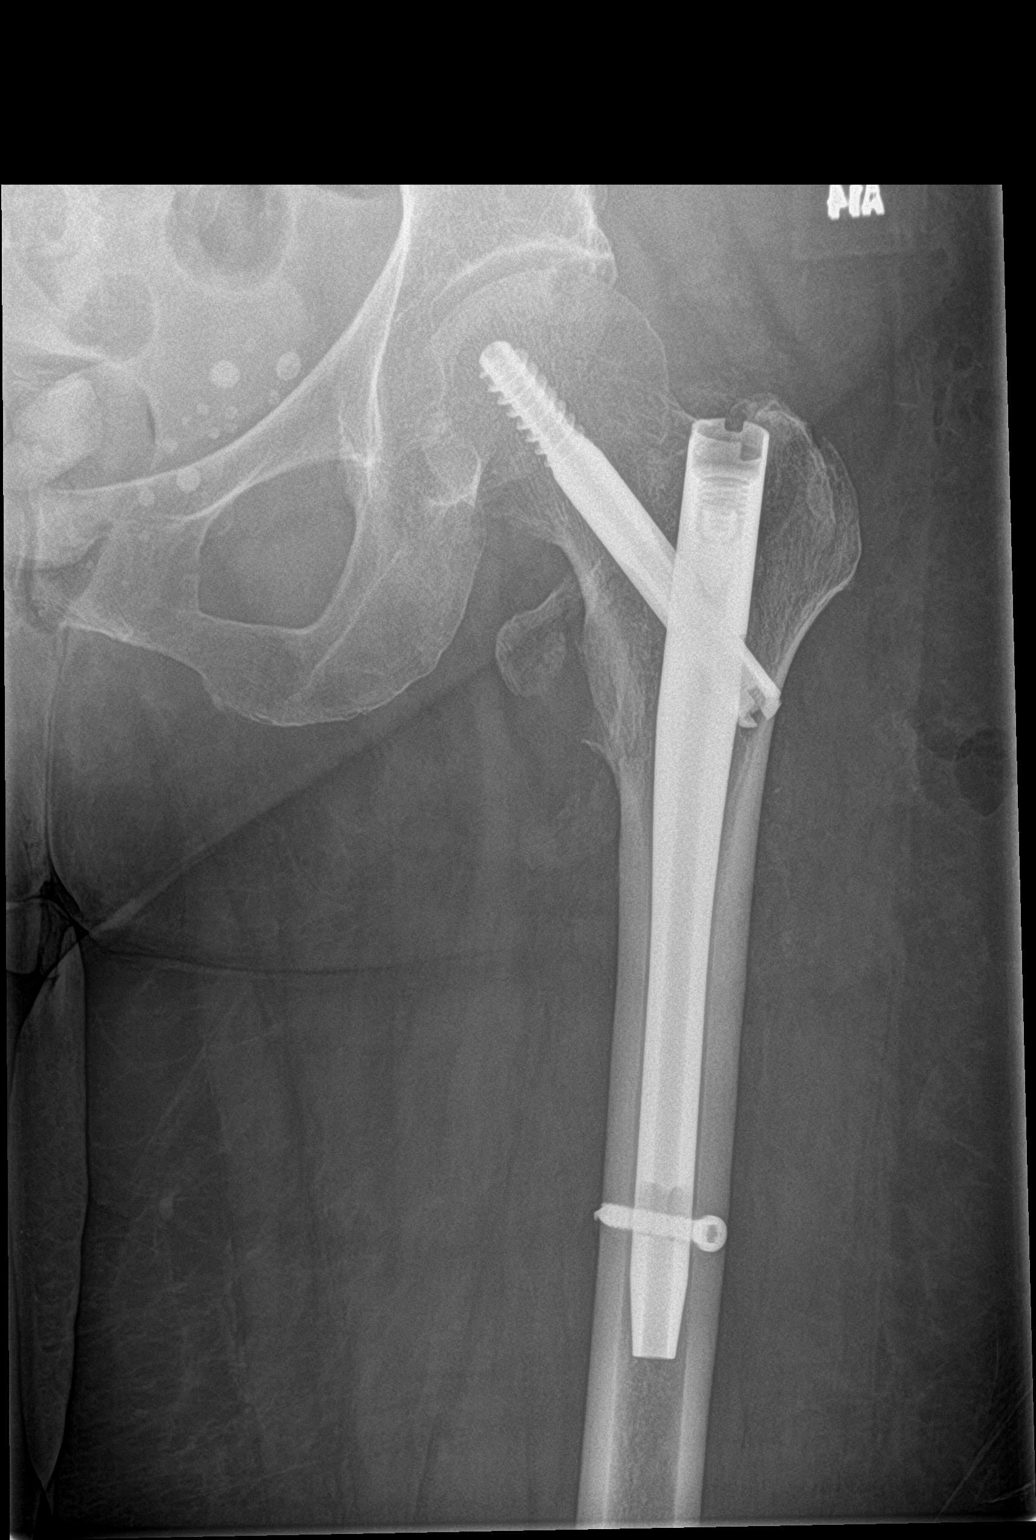

[hip lat]
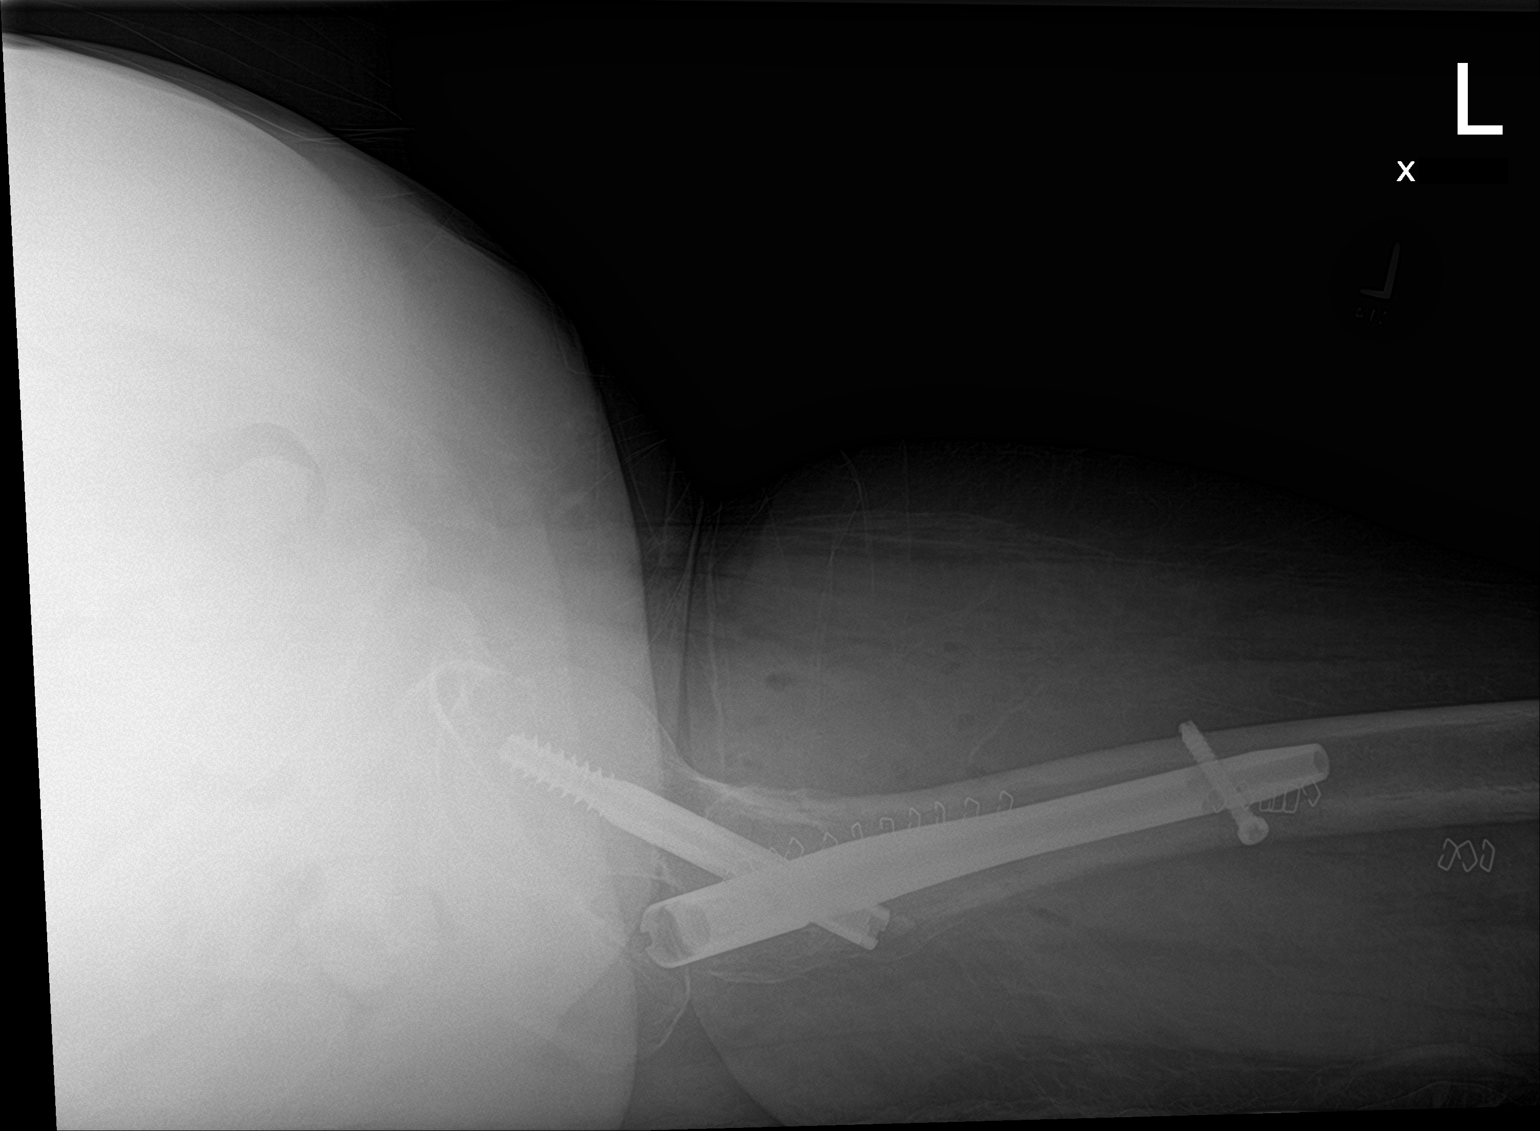

[3 of 3 positions shown; findings below may reference images not displayed]

FINDINGS: Prior lumbosacral spine fusion. ORIF left proximal femur. Hardware
intact. Anatomic alignment. Diffuse osteopenia. Degenerative changes
both hips. Stool noted throughout the colon. Surgical clips in the
pelvis.
IMPRESSION: 1. Prior lumbosacral spine fusion. Prior ORIF left hip. Anatomic
alignment. No acute abnormality.

2. Diffuse osteopenia degenerative change.
